# Patient Record
Sex: Male | Born: 1985
Health system: Southern US, Community
[De-identification: ages and names within clinical notes are randomized; demographics above are authoritative.]

## PROBLEM LIST (undated history)

## (undated) DIAGNOSIS — F1910 Other psychoactive substance abuse, uncomplicated: Secondary | ICD-10-CM

## (undated) DIAGNOSIS — F329 Major depressive disorder, single episode, unspecified: Secondary | ICD-10-CM

## (undated) DIAGNOSIS — B001 Herpesviral vesicular dermatitis: Secondary | ICD-10-CM

## (undated) DIAGNOSIS — F419 Anxiety disorder, unspecified: Secondary | ICD-10-CM

## (undated) DIAGNOSIS — F32A Depression, unspecified: Secondary | ICD-10-CM

## (undated) HISTORY — DX: Depression, unspecified: F32.A

## (undated) HISTORY — DX: Other psychoactive substance abuse, uncomplicated: F19.10

## (undated) HISTORY — DX: Major depressive disorder, single episode, unspecified: F32.9

## (undated) HISTORY — DX: Herpesviral vesicular dermatitis: B00.1

## (undated) HISTORY — DX: Anxiety disorder, unspecified: F41.9

---
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
## 2011-07-14 ENCOUNTER — Other Ambulatory Visit: Payer: Self-pay | Admitting: Family Medicine

## 2011-07-14 DIAGNOSIS — N50812 Left testicular pain: Secondary | ICD-10-CM

## 2011-07-18 ENCOUNTER — Ambulatory Visit
Admission: RE | Admit: 2011-07-18 | Discharge: 2011-07-18 | Disposition: A | Discharge status: Home / Self Care | Payer: BC Managed Care – PPO | Source: Ambulatory Visit | Attending: Family Medicine | Admitting: Family Medicine

## 2011-07-18 ENCOUNTER — Other Ambulatory Visit: Payer: Self-pay | Admitting: Family Medicine

## 2011-07-18 DIAGNOSIS — N50812 Left testicular pain: Secondary | ICD-10-CM

## 2013-07-03 IMAGING — US US SCROTUM
1 series · 13 of 25 positions shown · non-contrast
Comparison: None

CLINICAL DATA: Left testicular pain and small palpable lump for
several months

SCROTAL ULTRASOUND
DOPPLER ULTRASOUND OF THE TESTICLES
TECHNIQUE: Complete ultrasound examination of the testicles,
epididymis, and other scrotal structures was performed.  Color and
spectral Doppler ultrasound were also utilized to evaluate blood
flow to the testicles.

[Series 1: us scrotum · 0.07mm/px · 13 of 25 slices shown]
[im 1/25]
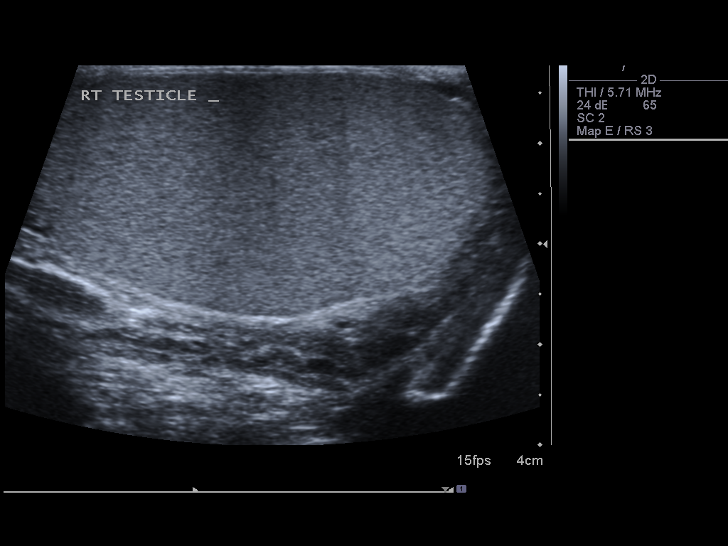
[im 3/25]
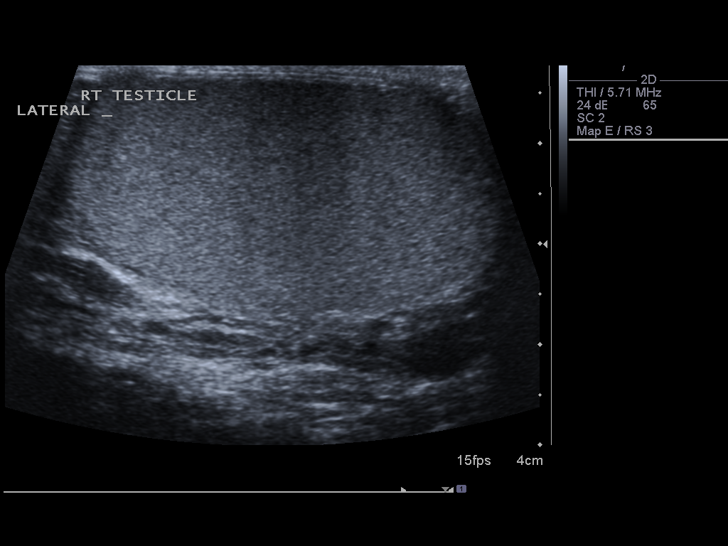
[im 5/25]
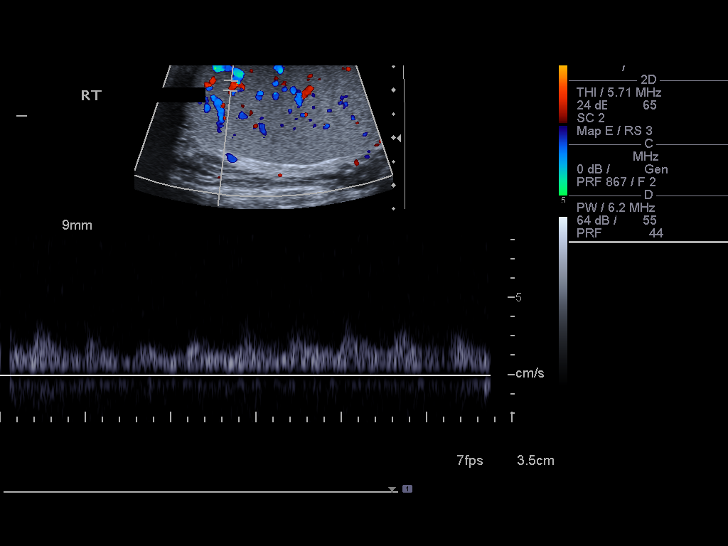
[im 7/25]
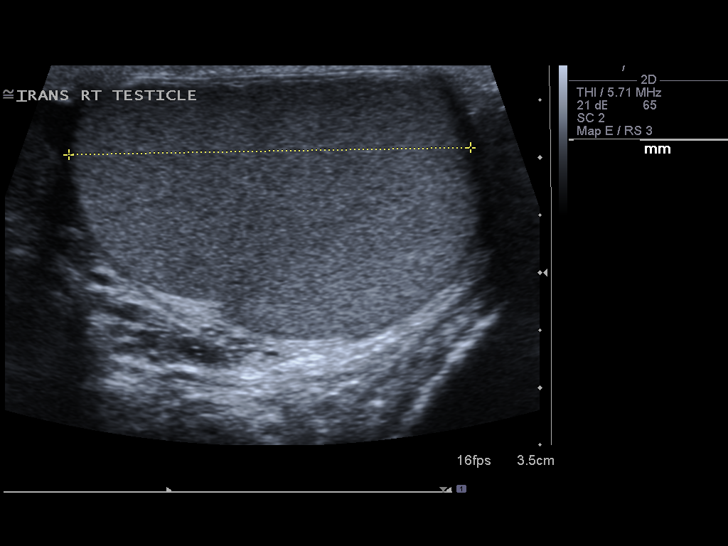
[im 9/25]
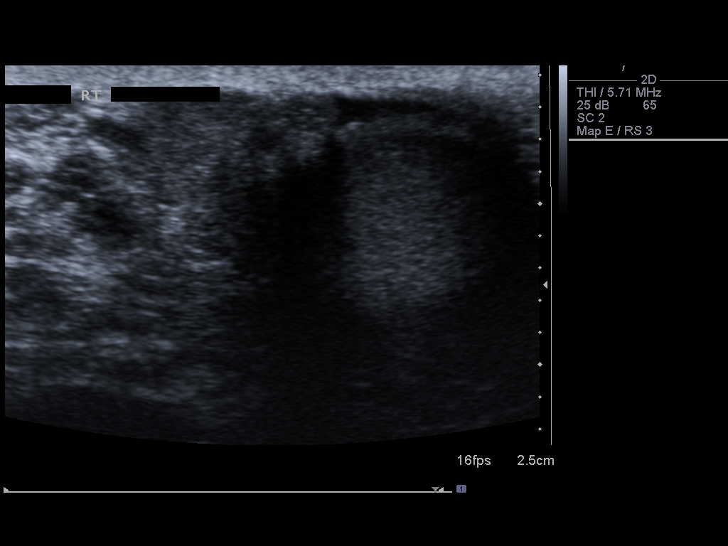
[im 11/25]
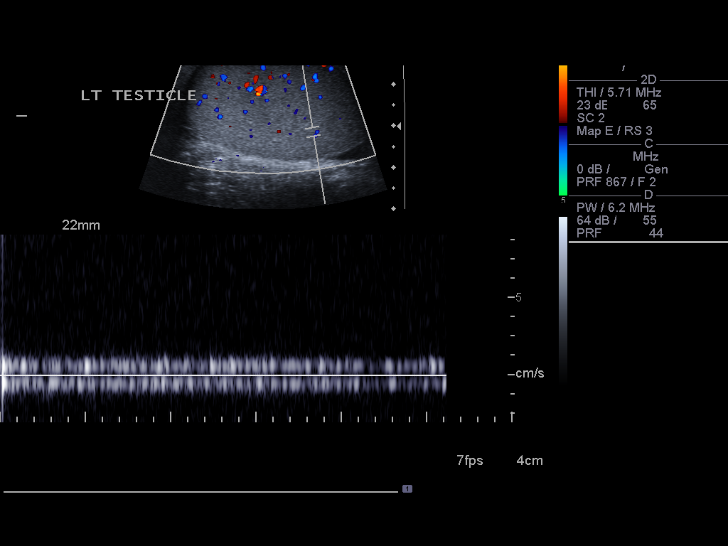
[im 13/25]
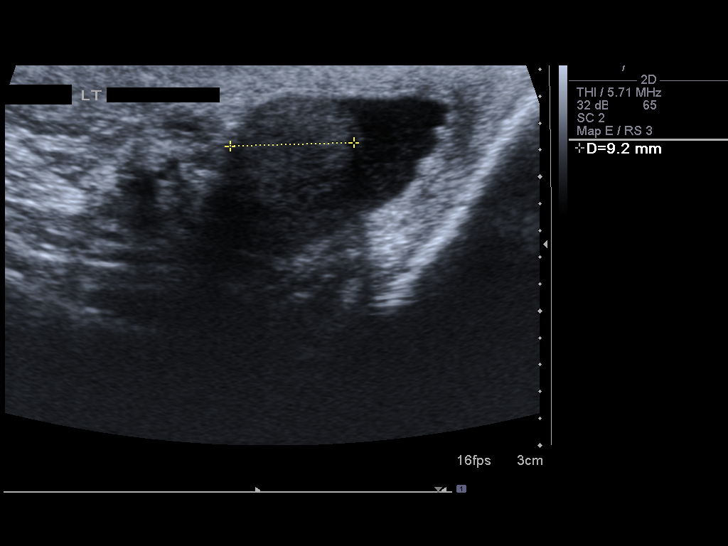
[im 15/25]
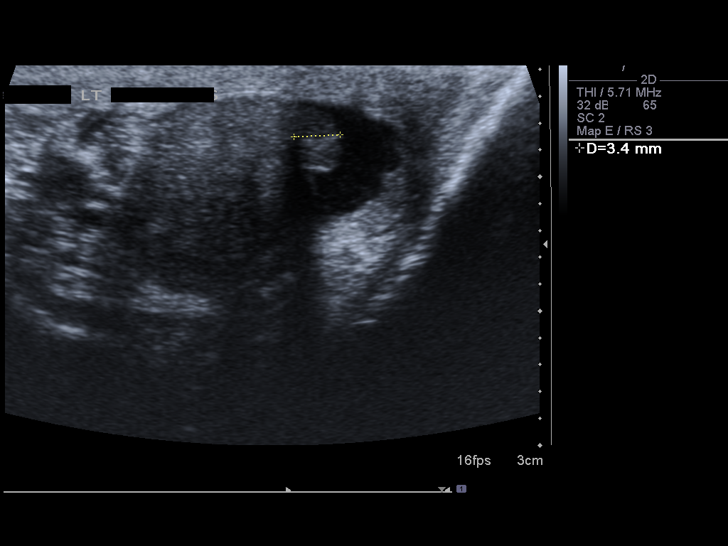
[im 17/25]
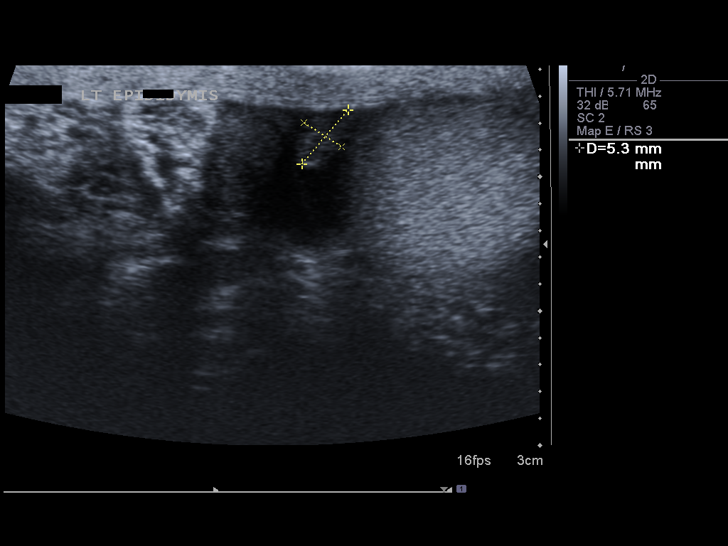
[im 19/25]
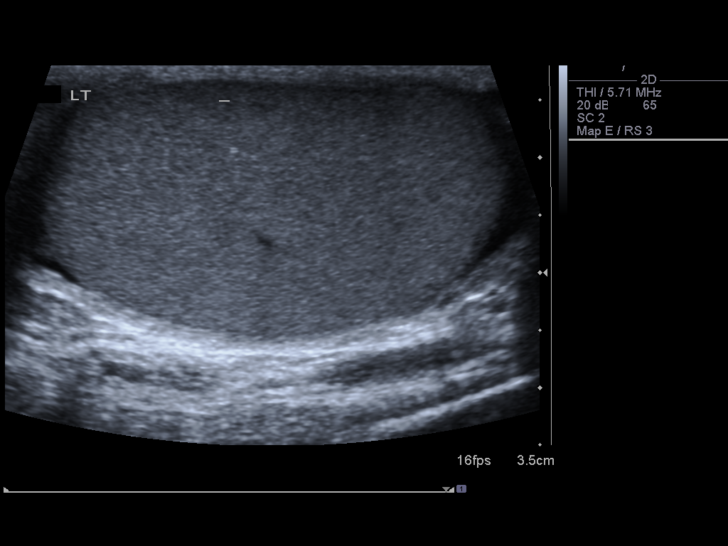
[im 21/25]
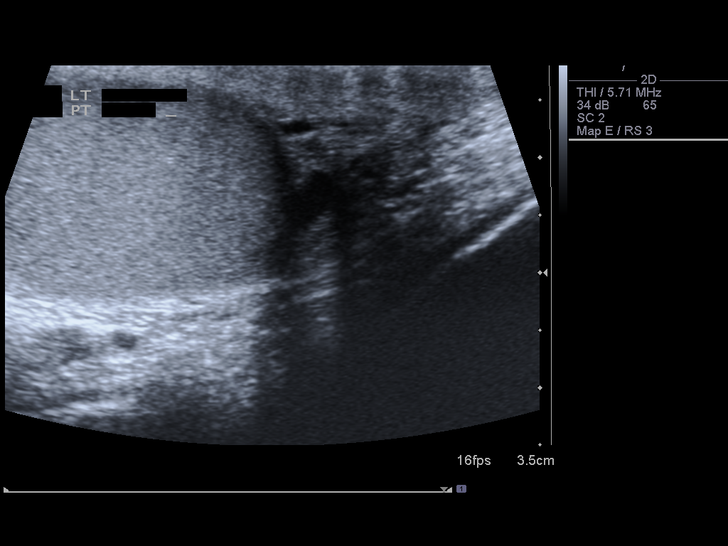
[im 23/25]
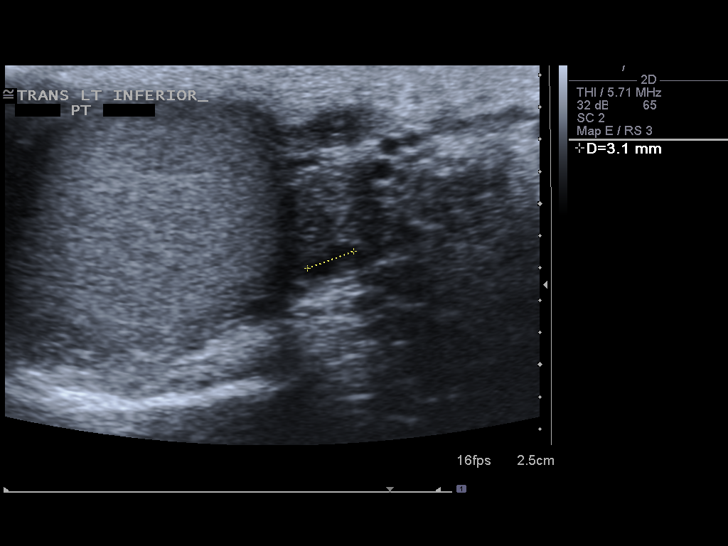
[im 25/25]
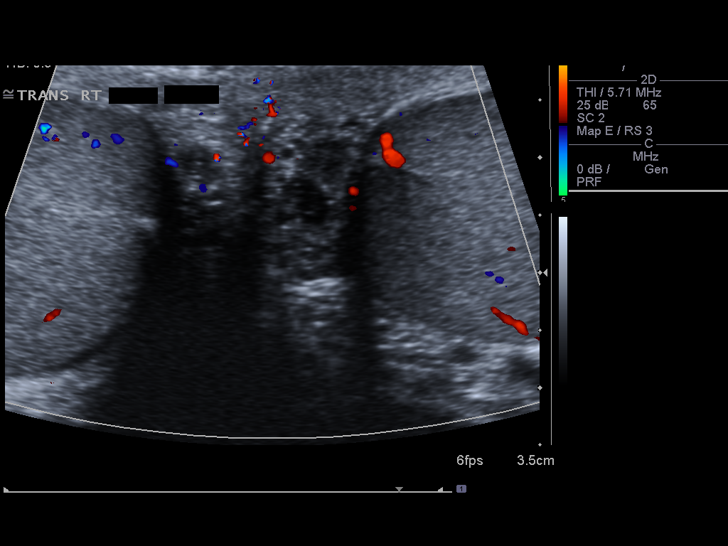

[13 of 25 positions shown; findings below may reference images not displayed]

FINDINGS: Right testis:  The right testicle measures 4.3 x 2.3 x 3.5 cm.
Blood flow is demonstrated with arterial and venous waveforms.

Left testis:  The left testicle measures 4.5 x 2.7 x 3.2 cm.  Blood
flow is demonstrated with arterial and venous waveforms.

Right epididymis:  The right epididymis appears normal.

Left epididymis:  The left epididymis is normal.

Hydocele:  It a small amount of fluid is noted bilaterally.

Varicocele:  No hydrocele is seen.

The area palpated appears to represent a small extratesticular
cystic structure inferior to the left testicle measuring 5 x 3 x 3
mm of doubtful significance.

Pulsed Doppler interrogation of both testes demonstrates low
resistance flow bilaterally.
IMPRESSION: 1.  No intratesticular abnormality.  Blood flow is demonstrated to
both testicles.
2.  Small cystic structure extratesticular and  inferior to the
left testicle appears to represent the palpable abnormality and is
of doubtful clinical significance.
3.  Small amount of fluid bilaterally.

## 2015-08-02 ENCOUNTER — Ambulatory Visit (INDEPENDENT_AMBULATORY_CARE_PROVIDER_SITE_OTHER): Payer: BC Managed Care – PPO | Admitting: Urgent Care

## 2015-08-02 DIAGNOSIS — G44209 Tension-type headache, unspecified, not intractable: Secondary | ICD-10-CM | POA: Diagnosis not present

## 2015-08-02 DIAGNOSIS — H0016 Chalazion left eye, unspecified eyelid: Secondary | ICD-10-CM

## 2015-08-02 DIAGNOSIS — M436 Torticollis: Secondary | ICD-10-CM | POA: Diagnosis not present

## 2015-08-02 MED ORDER — CYCLOBENZAPRINE HCL 5 MG PO TABS: 5.0000 mg | ORAL_TABLET | Freq: Every day | ORAL | Status: DC
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
## 2015-11-20 DIAGNOSIS — F3342 Major depressive disorder, recurrent, in full remission: Secondary | ICD-10-CM | POA: Diagnosis not present

## 2015-11-20 DIAGNOSIS — F410 Panic disorder [episodic paroxysmal anxiety] without agoraphobia: Secondary | ICD-10-CM | POA: Diagnosis not present

## 2015-11-20 DIAGNOSIS — F411 Generalized anxiety disorder: Secondary | ICD-10-CM | POA: Diagnosis not present

## 2015-12-14 DIAGNOSIS — Z202 Contact with and (suspected) exposure to infections with a predominantly sexual mode of transmission: Secondary | ICD-10-CM | POA: Diagnosis not present

## 2016-03-27 DIAGNOSIS — J029 Acute pharyngitis, unspecified: Secondary | ICD-10-CM | POA: Diagnosis not present

## 2016-05-13 DIAGNOSIS — F411 Generalized anxiety disorder: Secondary | ICD-10-CM | POA: Diagnosis not present

## 2016-05-13 DIAGNOSIS — F3342 Major depressive disorder, recurrent, in full remission: Secondary | ICD-10-CM | POA: Diagnosis not present

## 2016-06-26 DIAGNOSIS — Z7251 High risk heterosexual behavior: Secondary | ICD-10-CM | POA: Diagnosis not present

## 2016-06-26 DIAGNOSIS — K120 Recurrent oral aphthae: Secondary | ICD-10-CM | POA: Diagnosis not present

## 2016-07-07 ENCOUNTER — Telehealth: Payer: Self-pay | Admitting: Internal Medicine

## 2016-07-07 NOTE — Telephone Encounter (Signed)
Pt called request to be new pt. Please advise, he said he spoke with already.   [id number] goes by [employer]

## 2016-07-10 NOTE — Telephone Encounter (Signed)
yes

## 2016-07-11 NOTE — Telephone Encounter (Signed)
[name] please help when you get a chance

## 2016-07-11 NOTE — Telephone Encounter (Signed)
Patient already scheduled

## 2016-07-24 ENCOUNTER — Encounter: Payer: Self-pay | Admitting: Internal Medicine

## 2016-07-24 ENCOUNTER — Ambulatory Visit (INDEPENDENT_AMBULATORY_CARE_PROVIDER_SITE_OTHER): Payer: BLUE CROSS/BLUE SHIELD | Admitting: Internal Medicine

## 2016-07-24 ENCOUNTER — Other Ambulatory Visit (INDEPENDENT_AMBULATORY_CARE_PROVIDER_SITE_OTHER): Payer: BLUE CROSS/BLUE SHIELD

## 2016-07-24 VITALS — BP 130/86 | HR 88 | Temp 98.0°F | Resp 12 | Ht 76.0 in | Wt 185.8 lb

## 2016-07-24 DIAGNOSIS — Z206 Contact with and (suspected) exposure to human immunodeficiency virus [HIV]: Secondary | ICD-10-CM

## 2016-07-24 DIAGNOSIS — Z0000 Encounter for general adult medical examination without abnormal findings: Secondary | ICD-10-CM

## 2016-07-24 LAB — URINALYSIS, ROUTINE W REFLEX MICROSCOPIC
Bilirubin Urine: NEGATIVE
Ketones, ur: NEGATIVE
Leukocytes, UA: NEGATIVE
Nitrite: NEGATIVE
Specific Gravity, Urine: 1.015 (ref 1.000–1.030)
Total Protein, Urine: NEGATIVE
Urine Glucose: NEGATIVE
Urobilinogen, UA: 0.2 (ref 0.0–1.0)
pH: 6 (ref 5.0–8.0)

## 2016-07-24 LAB — CBC WITH DIFFERENTIAL/PLATELET
Basophils Absolute: 0 10*3/uL (ref 0.0–0.1)
Basophils Relative: 0.7 % (ref 0.0–3.0)
Eosinophils Absolute: 0 10*3/uL (ref 0.0–0.7)
Eosinophils Relative: 0.6 % (ref 0.0–5.0)
HCT: 42.4 % (ref 39.0–52.0)
Hemoglobin: 14.5 g/dL (ref 13.0–17.0)
Lymphocytes Relative: 30.5 % (ref 12.0–46.0)
Lymphs Abs: 1.9 10*3/uL (ref 0.7–4.0)
MCHC: 34.2 g/dL (ref 30.0–36.0)
MCV: 87.8 fl (ref 78.0–100.0)
Monocytes Absolute: 1.1 10*3/uL — ABNORMAL HIGH (ref 0.1–1.0)
Monocytes Relative: 17.4 % — ABNORMAL HIGH (ref 3.0–12.0)
Neutro Abs: 3.2 10*3/uL (ref 1.4–7.7)
Neutrophils Relative %: 50.8 % (ref 43.0–77.0)
Platelets: 214 10*3/uL (ref 150.0–400.0)
RBC: 4.82 Mil/uL (ref 4.22–5.81)
RDW: 12.1 % (ref 11.5–15.5)
WBC: 6.2 10*3/uL (ref 4.0–10.5)

## 2016-07-24 LAB — TSH: TSH: 0.98 u[IU]/mL (ref 0.35–4.50)

## 2016-07-24 LAB — LIPID PANEL
Cholesterol: 129 mg/dL (ref 0–200)
HDL: 35.6 mg/dL — ABNORMAL LOW (ref >39.00)
LDL Cholesterol: 69 mg/dL (ref 0–99)
NonHDL: 93.02
Total CHOL/HDL Ratio: 4
Triglycerides: 118 mg/dL (ref 0.0–149.0)
VLDL: 23.6 mg/dL (ref 0.0–40.0)

## 2016-07-24 LAB — COMPREHENSIVE METABOLIC PANEL
ALT: 29 U/L (ref 0–53)
AST: 24 U/L (ref 0–37)
Albumin: 4.4 g/dL (ref 3.5–5.2)
Alkaline Phosphatase: 56 U/L (ref 39–117)
BUN: 15 mg/dL (ref 6–23)
CO2: 28 mEq/L (ref 19–32)
Calcium: 9.1 mg/dL (ref 8.4–10.5)
Chloride: 104 mEq/L (ref 96–112)
Creatinine, Ser: 1.02 mg/dL (ref 0.40–1.50)
GFR: 90.24 mL/min (ref >60.00)
Glucose, Bld: 97 mg/dL (ref 70–99)
Potassium: 4.1 mEq/L (ref 3.5–5.1)
Sodium: 140 mEq/L (ref 135–145)
Total Bilirubin: 0.3 mg/dL (ref 0.2–1.2)
Total Protein: 6.9 g/dL (ref 6.0–8.3)

## 2016-07-24 LAB — HEPATITIS B SURFACE ANTIBODY,QUALITATIVE: Hep B S Ab: NEGATIVE

## 2016-07-24 LAB — HEPATITIS B SURFACE ANTIGEN: Hepatitis B Surface Ag: NEGATIVE

## 2016-07-24 LAB — HEPATITIS B CORE ANTIBODY, TOTAL: Hep B Core Total Ab: NONREACTIVE

## 2016-07-24 LAB — HEPATITIS A ANTIBODY, TOTAL: Hep A Total Ab: NONREACTIVE

## 2016-07-24 LAB — HIV ANTIBODY (ROUTINE TESTING W REFLEX): HIV 1&2 Ab, 4th Generation: NONREACTIVE

## 2016-07-24 NOTE — Patient Instructions (Signed)
Safe Sex Safe sex is about reducing the risk of giving or getting a sexually transmitted disease (STD). STDs are spread through sexual contact involving the genitals, mouth, or rectum. Some STDs can be cured and others cannot. Safe sex can also prevent unintended pregnancies.  WHAT ARE SOME SAFE SEX PRACTICES?  Limit your sexual activity to only one partner who is having sex with only you.  Talk to your partner about his or her past partners, past STDs, and drug use.  Use a condom every time you have sexual intercourse. This includes vaginal, oral, and anal sexual activity. Both females and males should wear condoms during oral sex. Only use latex or polyurethane condoms and water-based lubricants. Using petroleum-based lubricants or oils to lubricate a condom will weaken the condom and increase the chance that it will break. The condom should be in place from the beginning to the end of sexual activity. Wearing a condom reduces, but does not completely eliminate, your risk of getting or giving an STD. STDs can be spread by contact with infected body fluids and skin.  Get vaccinated for hepatitis B and HPV.  Avoid alcohol and recreational drugs, which can affect your judgment. You may forget to use a condom or participate in high-risk sex.  For females, avoid douching after sexual intercourse. Douching can spread an infection farther into the reproductive tract.  Check your body for signs of sores, blisters, rashes, or unusual discharge. See your health care provider if you notice any of these signs.  Avoid sexual contact if you have symptoms of an infection or are being treated for an STD. If you or your partner has herpes, avoid sexual contact when blisters are present. Use condoms at all other times.  If you are at risk of being infected with HIV, it is recommended that you take a prescription medicine daily to prevent HIV infection. This is called pre-exposure prophylaxis (PrEP). You are  considered at risk if:  You are a man who has sex with other men (MSM).  You are a heterosexual man or woman who is sexually active with more than one partner.  You take drugs by injection.  You are sexually active with a partner who has HIV.  Talk with your health care provider about whether you are at high risk of being infected with HIV. If you choose to begin PrEP, you should first be tested for HIV. You should then be tested every 3 months for as long as you are taking PrEP.  See your health care provider for regular screenings, exams, and tests for other STDs. Before having sex with a new partner, each of you should be screened for STDs and should talk about the results with each other. WHAT ARE THE BENEFITS OF SAFE SEX?   There is less chance of getting or giving an STD.  You can prevent unwanted or unintended pregnancies.  By discussing safe sex concerns with your partner, you may increase feelings of intimacy, comfort, trust, and honesty between the two of you. This information is not intended to replace advice given to you by your health care provider. Make sure you discuss any questions you have with your health care provider. Document Released: 08/03/2004 Document Revised: 07/17/2014 Document Reviewed: 05/16/2015 Elsevier Interactive Patient Education  2017 [employer].

## 2016-07-24 NOTE — Progress Notes (Signed)
Pre visit review using our clinic review tool, if applicable. No additional management support is needed unless otherwise documented below in the visit note. 

## 2016-07-24 NOTE — Progress Notes (Signed)
Subjective:  Patient ID: Zachary Becker, male    DOB: [birth year]  Age: 31 y.o. MRN: [id number]  CC: Annual Exam  NEW TO ME  HPI Zachary Becker presents for a CPX.  He is dating someone and is sexually active with them and they are HIV positive. He does admit to unprotected oral and anal intercourse. He thinks the partner has an undetectable viral load but he is not certain. He wants to take PreP to prevent HIV infection.  History Zachary Becker has a past medical history of Anxiety and Depression.   He has no past surgical history on file.   His family history is not on file.He reports that he has never smoked. He has never used smokeless tobacco. He reports that he does not drink alcohol or use drugs.  Outpatient Medications Prior to Visit  Medication Sig Dispense Refill  . ALPRAZolam (XANAX XR) 0.5 MG 24 hr tablet Take 0.5 mg by mouth daily.    Marland Kitchen buPROPion (WELLBUTRIN SR) 150 MG 12 hr tablet Take 150 mg by mouth 2 (two) times daily.    . cyclobenzaprine (FLEXERIL) 5 MG tablet Take 1-2 tablets (5-10 mg total) by mouth at bedtime. (Patient not taking: Reported on 07/24/2016) 60 tablet 1  . QUEtiapine (SEROQUEL) 100 MG tablet Take 150 mg by mouth at bedtime.     No facility-administered medications prior to visit.     ROS Review of Systems  Constitutional: Negative.  Negative for chills, fatigue and unexpected weight change.  HENT: Negative.  Negative for sore throat and trouble swallowing.   Eyes: Negative for visual disturbance.  Respiratory: Negative for cough, shortness of breath, wheezing and stridor.   Cardiovascular: Negative for chest pain, palpitations and leg swelling.  Gastrointestinal: Negative for abdominal pain, constipation, diarrhea, nausea and vomiting.  Endocrine: Negative.   Genitourinary: Negative.  Negative for difficulty urinating, discharge, dysuria, genital sores, hematuria, penile pain, scrotal swelling, testicular pain and urgency.    Musculoskeletal: Negative for back pain, myalgias and neck pain.  Skin: Negative.  Negative for color change and rash.  Allergic/Immunologic: Negative.   Neurological: Negative.  Negative for dizziness.  Hematological: Negative for adenopathy. Does not bruise/bleed easily.  Psychiatric/Behavioral: Negative.     Objective:  BP 130/86   Pulse 88   Temp 98 F (36.7 C) (Oral)   Resp 12   Ht 6\' 4"  (1.93 m)   Wt 185 lb 12.8 oz (84.3 kg)   SpO2 98%   BMI 22.62 kg/m   Physical Exam  Constitutional: He is oriented to person, place, and time. No distress.  HENT:  Mouth/Throat: Oropharynx is clear and moist. No oropharyngeal exudate.  Eyes: Conjunctivae are normal. Right eye exhibits no discharge. Left eye exhibits no discharge. No scleral icterus.  Neck: Normal range of motion. Neck supple. No JVD present. No tracheal deviation present. No thyromegaly present.  Cardiovascular: Normal rate, regular rhythm, normal heart sounds and intact distal pulses.  Exam reveals no gallop and no friction rub.   No murmur heard. Pulmonary/Chest: Effort normal and breath sounds normal. No stridor. No respiratory distress. He has no wheezes. He has no rales. He exhibits no tenderness.  Abdominal: Soft. Bowel sounds are normal. He exhibits no distension and no mass. There is no tenderness. There is no rebound and no guarding. Hernia confirmed negative in the right inguinal area and confirmed negative in the left inguinal area.  Genitourinary: Testes normal and penis normal. Right testis shows no mass, no  swelling and no tenderness. Right testis is descended. Left testis shows no mass, no swelling and no tenderness. Left testis is descended. Circumcised. No penile erythema or penile tenderness. No discharge found.  Musculoskeletal: Normal range of motion. He exhibits no edema, tenderness or deformity.  Lymphadenopathy:    He has no cervical adenopathy.       Right: No inguinal adenopathy present.       Left:  No inguinal adenopathy present.  Neurological: He is oriented to person, place, and time.  Skin: Skin is warm and dry. No rash noted. He is not diaphoretic. No erythema. No pallor.  Psychiatric: He has a normal mood and affect. His behavior is normal. Judgment and thought content normal.  Vitals reviewed.   Lab Results  Component Value Date   WBC 6.2 07/24/2016   HGB 14.5 07/24/2016   HCT 42.4 07/24/2016   PLT 214.0 07/24/2016   GLUCOSE 97 07/24/2016   CHOL 129 07/24/2016   TRIG 118.0 07/24/2016   HDL 35.60 (L) 07/24/2016   LDLCALC 69 07/24/2016   ALT 29 07/24/2016   AST 24 07/24/2016   NA 140 07/24/2016   K 4.1 07/24/2016   CL 104 07/24/2016   CREATININE 1.02 07/24/2016   BUN 15 07/24/2016   CO2 28 07/24/2016   TSH 0.98 07/24/2016    Assessment & Plan:   Zachary Becker was seen today for annual exam.  Diagnoses and all orders for this visit:  Routine general medical examination at a health care facility- exam completed, labs ordered and reviewed, vaccines reviewed and updated, he has no antibodies to hepatitis A and B so I asked him to return to start the vaccine process, patient education material was given. -     Lipid panel; Future -     Comprehensive metabolic panel; Future -     CBC with Differential/Platelet; Future -     TSH; Future -     Urinalysis, Routine w reflex microscopic; Future -     Hepatitis B surface antigen; Future -     HIV antibody; Future -     Hepatitis A antibody, total; Future -     Hepatitis B core antibody, total; Future -     Hepatitis B surface antibody; Future -     RPR; Future  HIV exposure- he is HIV-negative, He is negative for hepatitis A and B infection, and has normal renal function, he will start Truvada but I've also asked him to adhere to safe sexual practices, I've asked him to return in 3 months to monitor his renal function and to screen for sexually transmitted infections. -     emtricitabine-tenofovir (TRUVADA) 200-300 MG tablet;  Take 1 tablet by mouth daily.   I have discontinued Zachary Becker buPROPion, ALPRAZolam, QUEtiapine, cyclobenzaprine, and buPROPion. I am also having him start on emtricitabine-tenofovir.  Meds ordered this encounter  Medications  . DISCONTD: buPROPion (WELLBUTRIN XL) 300 MG 24 hr tablet    Sig: Take 300 mg by mouth daily.  Marland Kitchen emtricitabine-tenofovir (TRUVADA) 200-300 MG tablet    Sig: Take 1 tablet by mouth daily.    Dispense:  90 tablet    Refill:  1     Follow-up: Return in about 3 months (around 10/22/2016).  Scarlette Calico, MD

## 2016-07-25 ENCOUNTER — Encounter: Payer: Self-pay | Admitting: Internal Medicine

## 2016-07-25 LAB — RPR

## 2016-07-25 MED ORDER — EMTRICITABINE-TENOFOVIR DF 200-300 MG PO TABS: 1.0000 | ORAL_TABLET | Freq: Every day | ORAL | 1 refills | Status: DC

## 2016-08-13 ENCOUNTER — Encounter: Payer: Self-pay | Admitting: Internal Medicine

## 2016-08-22 ENCOUNTER — Ambulatory Visit (INDEPENDENT_AMBULATORY_CARE_PROVIDER_SITE_OTHER): Payer: BLUE CROSS/BLUE SHIELD | Admitting: Internal Medicine

## 2016-08-22 ENCOUNTER — Encounter: Payer: Self-pay | Admitting: Internal Medicine

## 2016-08-22 ENCOUNTER — Other Ambulatory Visit (INDEPENDENT_AMBULATORY_CARE_PROVIDER_SITE_OTHER): Payer: BLUE CROSS/BLUE SHIELD

## 2016-08-22 VITALS — BP 120/70 | HR 103 | Temp 98.1°F | Resp 16 | Wt 184.2 lb

## 2016-08-22 DIAGNOSIS — Z206 Contact with and (suspected) exposure to human immunodeficiency virus [HIV]: Secondary | ICD-10-CM | POA: Diagnosis not present

## 2016-08-22 DIAGNOSIS — D72821 Monocytosis (symptomatic): Secondary | ICD-10-CM

## 2016-08-22 DIAGNOSIS — Z79899 Other long term (current) drug therapy: Secondary | ICD-10-CM

## 2016-08-22 DIAGNOSIS — B001 Herpesviral vesicular dermatitis: Secondary | ICD-10-CM

## 2016-08-22 DIAGNOSIS — Z0000 Encounter for general adult medical examination without abnormal findings: Secondary | ICD-10-CM | POA: Insufficient documentation

## 2016-08-22 LAB — BASIC METABOLIC PANEL
BUN: 15 mg/dL (ref 6–23)
CO2: 31 mEq/L (ref 19–32)
Calcium: 9.5 mg/dL (ref 8.4–10.5)
Chloride: 104 mEq/L (ref 96–112)
Creatinine, Ser: 1.11 mg/dL (ref 0.40–1.50)
GFR: 81.81 mL/min (ref >60.00)
Glucose, Bld: 99 mg/dL (ref 70–99)
Potassium: 4.9 mEq/L (ref 3.5–5.1)
Sodium: 140 mEq/L (ref 135–145)

## 2016-08-22 MED ORDER — VALACYCLOVIR HCL 1 G PO TABS: 1000.0000 mg | ORAL_TABLET | Freq: Three times a day (TID) | ORAL | 3 refills | Status: DC

## 2016-08-22 NOTE — Patient Instructions (Signed)
Safe Sex Safe sex is about reducing the risk of giving or getting a sexually transmitted disease (STD). STDs are spread through sexual contact involving the genitals, mouth, or rectum. Some STDs can be cured and others cannot. Safe sex can also prevent unintended pregnancies.  WHAT ARE SOME SAFE SEX PRACTICES?  Limit your sexual activity to only one partner who is having sex with only you.  Talk to your partner about his or her past partners, past STDs, and drug use.  Use a condom every time you have sexual intercourse. This includes vaginal, oral, and anal sexual activity. Both females and males should wear condoms during oral sex. Only use latex or polyurethane condoms and water-based lubricants. Using petroleum-based lubricants or oils to lubricate a condom will weaken the condom and increase the chance that it will break. The condom should be in place from the beginning to the end of sexual activity. Wearing a condom reduces, but does not completely eliminate, your risk of getting or giving an STD. STDs can be spread by contact with infected body fluids and skin.  Get vaccinated for hepatitis B and HPV.  Avoid alcohol and recreational drugs, which can affect your judgment. You may forget to use a condom or participate in high-risk sex.  For females, avoid douching after sexual intercourse. Douching can spread an infection farther into the reproductive tract.  Check your body for signs of sores, blisters, rashes, or unusual discharge. See your health care provider if you notice any of these signs.  Avoid sexual contact if you have symptoms of an infection or are being treated for an STD. If you or your partner has herpes, avoid sexual contact when blisters are present. Use condoms at all other times.  If you are at risk of being infected with HIV, it is recommended that you take a prescription medicine daily to prevent HIV infection. This is called pre-exposure prophylaxis (PrEP). You are  considered at risk if:  You are a man who has sex with other men (MSM).  You are a heterosexual man or woman who is sexually active with more than one partner.  You take drugs by injection.  You are sexually active with a partner who has HIV.  Talk with your health care provider about whether you are at high risk of being infected with HIV. If you choose to begin PrEP, you should first be tested for HIV. You should then be tested every 3 months for as long as you are taking PrEP.  See your health care provider for regular screenings, exams, and tests for other STDs. Before having sex with a new partner, each of you should be screened for STDs and should talk about the results with each other. WHAT ARE THE BENEFITS OF SAFE SEX?   There is less chance of getting or giving an STD.  You can prevent unwanted or unintended pregnancies.  By discussing safe sex concerns with your partner, you may increase feelings of intimacy, comfort, trust, and honesty between the two of you. This information is not intended to replace advice given to you by your health care provider. Make sure you discuss any questions you have with your health care provider. Document Released: 08/03/2004 Document Revised: 07/17/2014 Document Reviewed: 05/16/2015 Elsevier Interactive Patient Education  2017 [employer].

## 2016-08-22 NOTE — Progress Notes (Signed)
Subjective:  Patient ID: Zachary Becker, male    DOB: 04/03/1986  Age: 31 y.o. MRN: [id number]  CC: Rectal Pain   HPI Zachary Becker presents for Concerns that he has noticed a bump around the right side of his anus for several days. It is not causing him any discomfort, itching, bleeding, or soreness.  He also returns for an HIV test. He had an HIV-positive partner until a month ago and they have since broken up. His last exposure to the HIV-positive partner was about a month ago. He wants to continue taking PrEP because he says he has about 5 or 6 partners/year, he uses some hookup sites and does admit to occasional unprotected intercourse.  He has cold sore outbreaks about 3 or 4 times a year and wants to continue taking Valtrex as needed.  Outpatient Medications Prior to Visit  Medication Sig Dispense Refill  . emtricitabine-tenofovir (TRUVADA) 200-300 MG tablet Take 1 tablet by mouth daily. 90 tablet 1   No facility-administered medications prior to visit.     ROS Review of Systems  Constitutional: Negative for chills and fever.  HENT: Negative.  Negative for sore throat and trouble swallowing.   Eyes: Negative for visual disturbance.  Respiratory: Negative for cough, chest tightness, shortness of breath and wheezing.   Cardiovascular: Negative.  Negative for chest pain, palpitations and leg swelling.  Gastrointestinal: Negative for abdominal pain, anal bleeding, blood in stool, diarrhea, nausea and rectal pain.  Endocrine: Negative.   Genitourinary: Negative.  Negative for difficulty urinating, discharge, dysuria, genital sores, penile swelling, scrotal swelling and testicular pain.  Musculoskeletal: Negative.   Allergic/Immunologic: Negative.   Neurological: Negative.   Hematological: Negative.  Negative for adenopathy. Does not bruise/bleed easily.  Psychiatric/Behavioral: Negative.     Objective:  BP 120/70 (BP Location: Left Arm, Patient Position:  Sitting, Cuff Size: Normal)   Pulse (!) 103   Temp 98.1 F (36.7 C) (Oral)   Resp 16   Wt 184 lb 4 oz (83.6 kg)   SpO2 98%   BMI 22.43 kg/m   BP Readings from Last 3 Encounters:  08/22/16 120/70  07/24/16 130/86  08/02/15 126/79    Wt Readings from Last 3 Encounters:  08/22/16 184 lb 4 oz (83.6 kg)  07/24/16 185 lb 12.8 oz (84.3 kg)  08/02/15 185 lb (83.9 kg)    Physical Exam  Constitutional: He is oriented to person, place, and time. No distress.  HENT:  Mouth/Throat: Oropharynx is clear and moist. No oropharyngeal exudate.  Eyes: Conjunctivae are normal. Right eye exhibits no discharge. Left eye exhibits no discharge. No scleral icterus.  Neck: Normal range of motion. Neck supple. No JVD present. No tracheal deviation present. No thyromegaly present.  Cardiovascular: Normal rate, regular rhythm, normal heart sounds and intact distal pulses.  Exam reveals no gallop and no friction rub.   No murmur heard. Pulmonary/Chest: Effort normal and breath sounds normal. No stridor. No respiratory distress. He has no wheezes. He has no rales. He exhibits no tenderness.  Abdominal: Soft. Bowel sounds are normal. He exhibits no distension and no mass. There is no tenderness. There is no rebound and no guarding. Hernia confirmed negative in the left inguinal area.  Genitourinary: Testes normal and penis normal. Rectal exam shows external hemorrhoid. Rectal exam shows no internal hemorrhoid, no fissure, no mass, no tenderness, anal tone normal and guaiac negative stool. Right testis shows no mass, no swelling and no tenderness. Right testis is descended.  Left testis shows no mass, no swelling and no tenderness. Left testis is descended. Circumcised. No penile erythema or penile tenderness. No discharge found.     Musculoskeletal: Normal range of motion. He exhibits no edema, tenderness or deformity.  Lymphadenopathy:    He has no cervical adenopathy.       Right: No inguinal adenopathy  present.       Left: No inguinal adenopathy present.  Neurological: He is oriented to person, place, and time.  Skin: Skin is warm and dry. No rash noted. He is not diaphoretic. No erythema. No pallor.  Vitals reviewed.   Lab Results  Component Value Date   WBC 6.2 07/24/2016   HGB 14.5 07/24/2016   HCT 42.4 07/24/2016   PLT 214.0 07/24/2016   GLUCOSE 99 08/22/2016   CHOL 129 07/24/2016   TRIG 118.0 07/24/2016   HDL 35.60 (L) 07/24/2016   LDLCALC 69 07/24/2016   ALT 29 07/24/2016   AST 24 07/24/2016   NA 140 08/22/2016   K 4.9 08/22/2016   CL 104 08/22/2016   CREATININE 1.11 08/22/2016   BUN 15 08/22/2016   CO2 31 08/22/2016   TSH 0.98 07/24/2016    US Scrotum  Result Date: 07/18/2011 *RADIOLOGY REPORT* Clinical Data:  Left testicular pain and small palpable lump for several months SCROTAL ULTRASOUND DOPPLER ULTRASOUND OF THE TESTICLES Technique: Complete ultrasound examination of the testicles, epididymis, and other scrotal structures was performed.  Color and spectral Doppler ultrasound were also utilized to evaluate blood flow to the testicles. Comparison:  None Findings: Right testis:  The right testicle measures 4.3 x 2.3 x 3.5 cm. Blood flow is demonstrated with arterial and venous waveforms. Left testis:  The left testicle measures 4.5 x 2.7 x 3.2 cm.  Blood flow is demonstrated with arterial and venous waveforms. Right epididymis:  The right epididymis appears normal. Left epididymis:  The left epididymis is normal. Hydocele:  It a small amount of fluid is noted bilaterally. Varicocele:  No hydrocele is seen. The area palpated appears to represent a small extratesticular cystic structure inferior to the left testicle measuring 5 x 3 x 3 mm of doubtful significance. Pulsed Doppler interrogation of both testes demonstrates low resistance flow bilaterally. IMPRESSION: 1.  No intratesticular abnormality.  Blood flow is demonstrated to both testicles. 2.  Small cystic structure  extratesticular and  inferior to the left testicle appears to represent the palpable abnormality and is of doubtful clinical significance. 3.  Small amount of fluid bilaterally. Original Report Authenticated By: Joretta Bachelor, M.D.  [place] Art/ven Flow Abd Pelv Doppler  Result Date: 07/18/2011 *RADIOLOGY REPORT* Clinical Data:  Left testicular pain and small palpable lump for several months SCROTAL ULTRASOUND DOPPLER ULTRASOUND OF THE TESTICLES Technique: Complete ultrasound examination of the testicles, epididymis, and other scrotal structures was performed.  Color and spectral Doppler ultrasound were also utilized to evaluate blood flow to the testicles. Comparison:  None Findings: Right testis:  The right testicle measures 4.3 x 2.3 x 3.5 cm. Blood flow is demonstrated with arterial and venous waveforms. Left testis:  The left testicle measures 4.5 x 2.7 x 3.2 cm.  Blood flow is demonstrated with arterial and venous waveforms. Right epididymis:  The right epididymis appears normal. Left epididymis:  The left epididymis is normal. Hydocele:  It a small amount of fluid is noted bilaterally. Varicocele:  No hydrocele is seen. The area palpated appears to represent a small extratesticular cystic structure inferior to the left testicle measuring  5 x 3 x 3 mm of doubtful significance. Pulsed Doppler interrogation of both testes demonstrates low resistance flow bilaterally. IMPRESSION: 1.  No intratesticular abnormality.  Blood flow is demonstrated to both testicles. 2.  Small cystic structure extratesticular and  inferior to the left testicle appears to represent the palpable abnormality and is of doubtful clinical significance. 3.  Small amount of fluid bilaterally. Original Report Authenticated By: Joretta Bachelor, M.D.   Assessment & Plan:   Khamal was seen today for rectal pain.  Diagnoses and all orders for this visit:  HIV exposure- he remains HIV neg, will cont truvada -     HIV antibody; Future  Herpes  labialis without complication -     valACYclovir (VALTREX) 1000 MG tablet; Take 1 tablet (1,000 mg total) by mouth 3 (three) times daily.  Encounter for long-term (current) use of high-risk medication- his renal fxn is normal -     Basic metabolic panel; Future  Monocytosis- will recheck his CBC during his next visit   I have changed Zachary Becker ValACYclovir HCl (VALTREX PO) to valACYclovir (VALTREX) 1000 MG tablet. I am also having him maintain his buPROPion and emtricitabine-tenofovir.  Meds ordered this encounter  Medications  . buPROPion (WELLBUTRIN XL) 300 MG 24 hr tablet    Sig: Take 300 mg by mouth daily.  Marland Kitchen emtricitabine-tenofovir (TRUVADA) 200-300 MG tablet    Sig: Take 1 tablet by mouth daily.  Marland Kitchen DISCONTD: ValACYclovir HCl (VALTREX PO)    Sig: Take by mouth.  . valACYclovir (VALTREX) 1000 MG tablet    Sig: Take 1 tablet (1,000 mg total) by mouth 3 (three) times daily.    Dispense:  15 tablet    Refill:  3     Follow-up: Return in about 3 months (around 11/19/2016).  Scarlette Calico, MD

## 2016-08-22 NOTE — Progress Notes (Signed)
Pre visit review using our clinic review tool, if applicable. No additional management support is needed unless otherwise documented below in the visit note. 

## 2016-08-23 ENCOUNTER — Other Ambulatory Visit: Payer: Self-pay | Admitting: Internal Medicine

## 2016-08-23 ENCOUNTER — Encounter: Payer: Self-pay | Admitting: Internal Medicine

## 2016-08-23 LAB — HIV ANTIBODY (ROUTINE TESTING W REFLEX): HIV 1&2 Ab, 4th Generation: NONREACTIVE

## 2016-08-24 DIAGNOSIS — D72821 Monocytosis (symptomatic): Secondary | ICD-10-CM | POA: Insufficient documentation

## 2016-10-31 ENCOUNTER — Encounter: Payer: Self-pay | Admitting: Internal Medicine

## 2016-10-31 ENCOUNTER — Ambulatory Visit (INDEPENDENT_AMBULATORY_CARE_PROVIDER_SITE_OTHER): Payer: BLUE CROSS/BLUE SHIELD | Admitting: Internal Medicine

## 2016-10-31 ENCOUNTER — Other Ambulatory Visit (INDEPENDENT_AMBULATORY_CARE_PROVIDER_SITE_OTHER): Payer: BLUE CROSS/BLUE SHIELD

## 2016-10-31 VITALS — BP 126/80 | HR 80 | Temp 98.0°F | Resp 16 | Ht 74.45 in | Wt 188.5 lb

## 2016-10-31 DIAGNOSIS — D72821 Monocytosis (symptomatic): Secondary | ICD-10-CM

## 2016-10-31 DIAGNOSIS — Z720 Tobacco use: Secondary | ICD-10-CM | POA: Diagnosis not present

## 2016-10-31 LAB — CBC WITH DIFFERENTIAL/PLATELET
Basophils Absolute: 0 10*3/uL (ref 0.0–0.1)
Basophils Relative: 0.5 % (ref 0.0–3.0)
Eosinophils Absolute: 0.1 10*3/uL (ref 0.0–0.7)
Eosinophils Relative: 1 % (ref 0.0–5.0)
HCT: 43.9 % (ref 39.0–52.0)
Hemoglobin: 15 g/dL (ref 13.0–17.0)
Lymphocytes Relative: 36.4 % (ref 12.0–46.0)
Lymphs Abs: 3.1 10*3/uL (ref 0.7–4.0)
MCHC: 34.2 g/dL (ref 30.0–36.0)
MCV: 90.8 fl (ref 78.0–100.0)
Monocytes Absolute: 0.8 10*3/uL (ref 0.1–1.0)
Monocytes Relative: 9.6 % (ref 3.0–12.0)
Neutro Abs: 4.4 10*3/uL (ref 1.4–7.7)
Neutrophils Relative %: 52.5 % (ref 43.0–77.0)
Platelets: 257 10*3/uL (ref 150.0–400.0)
RBC: 4.84 Mil/uL (ref 4.22–5.81)
RDW: 12.6 % (ref 11.5–15.5)
WBC: 8.5 10*3/uL (ref 4.0–10.5)

## 2016-10-31 MED ORDER — VARENICLINE TARTRATE 0.5 MG X 11 & 1 MG X 42 PO MISC: ORAL | 0 refills | Status: DC

## 2016-10-31 MED ORDER — VARENICLINE TARTRATE 1 MG PO TABS: 1.0000 mg | ORAL_TABLET | Freq: Two times a day (BID) | ORAL | 3 refills | Status: DC

## 2016-10-31 NOTE — Progress Notes (Signed)
Pre visit review using our clinic review tool, if applicable. No additional management support is needed unless otherwise documented below in the visit note. 

## 2016-10-31 NOTE — Progress Notes (Signed)
Subjective:  Patient ID: Zachary Becker, male    DOB: 1985-12-29  Age: 31 y.o. MRN: [id number]  CC: Nicotine Dependence   HPI Zachary Becker presents for f/up - He has decided to stop taking Truvada. He went through a breakup about 3 months ago and has not been sexually active since then and he doesn't anticipate any sexual activity in the near future.  He wants to try something to help him quit smoking. His last cigarette was about 3 or 4 days prior to this visit and he has not had much in the way of withdrawal symptoms or cravings. He has quit before but the cravings caused him to relapse.  He is also due for follow-up on his CBC. His last CBC shows a mild monocytosis.  Outpatient Medications Prior to Visit  Medication Sig Dispense Refill  . buPROPion (WELLBUTRIN XL) 300 MG 24 hr tablet Take 300 mg by mouth daily.    . valACYclovir (VALTREX) 1000 MG tablet Take 1 tablet (1,000 mg total) by mouth 3 (three) times daily. (Patient not taking: Reported on 10/31/2016) 15 tablet 3  . emtricitabine-tenofovir (TRUVADA) 200-300 MG tablet Take 1 tablet by mouth daily.     No facility-administered medications prior to visit.     ROS Review of Systems  Constitutional: Negative for chills, fatigue and fever.  HENT: Negative for sore throat.   Respiratory: Negative for cough.   Cardiovascular: Negative.   Gastrointestinal: Negative for abdominal pain.  Genitourinary: Negative.  Negative for difficulty urinating, dysuria and genital sores.  Musculoskeletal: Negative.   Skin: Negative.  Negative for color change and rash.  Neurological: Negative.   Hematological: Negative for adenopathy. Does not bruise/bleed easily.  Psychiatric/Behavioral: Negative.   All other systems reviewed and are negative.   Objective:  BP 126/80 (BP Location: Left Arm, Patient Position: Sitting, Cuff Size: Normal)   Pulse 80   Temp 98 F (36.7 C) (Oral)   Resp 16   Ht 6' 2.45" (1.891 m)   Wt  188 lb 8 oz (85.5 kg)   SpO2 98%   BMI 23.91 kg/m   BP Readings from Last 3 Encounters:  10/31/16 126/80  08/22/16 120/70  07/24/16 130/86    Wt Readings from Last 3 Encounters:  10/31/16 188 lb 8 oz (85.5 kg)  08/22/16 184 lb 4 oz (83.6 kg)  07/24/16 185 lb 12.8 oz (84.3 kg)    Physical Exam  Constitutional: He is oriented to person, place, and time. No distress.  HENT:  Mouth/Throat: Oropharynx is clear and moist. No oropharyngeal exudate.  Eyes: Conjunctivae are normal. Right eye exhibits no discharge. Left eye exhibits no discharge. No scleral icterus.  Neck: Normal range of motion. Neck supple. No JVD present. No tracheal deviation present. No thyromegaly present.  Cardiovascular: Normal rate, regular rhythm, normal heart sounds and intact distal pulses.  Exam reveals no gallop and no friction rub.   No murmur heard. Pulmonary/Chest: Effort normal and breath sounds normal. No stridor. No respiratory distress. He has no wheezes. He has no rales. He exhibits no tenderness.  Abdominal: Soft. Bowel sounds are normal. He exhibits no distension and no mass. There is no tenderness. There is no rebound and no guarding.  Musculoskeletal: Normal range of motion. He exhibits no edema, tenderness or deformity.  Lymphadenopathy:    He has no cervical adenopathy.  Neurological: He is oriented to person, place, and time.  Skin: Skin is warm and dry. No rash noted. He  is not diaphoretic. No erythema. No pallor.  Psychiatric: He has a normal mood and affect. His behavior is normal. Judgment and thought content normal.  Vitals reviewed.   Lab Results  Component Value Date   WBC 8.5 10/31/2016   HGB 15.0 10/31/2016   HCT 43.9 10/31/2016   PLT 257.0 10/31/2016   GLUCOSE 99 08/22/2016   CHOL 129 07/24/2016   TRIG 118.0 07/24/2016   HDL 35.60 (L) 07/24/2016   LDLCALC 69 07/24/2016   ALT 29 07/24/2016   AST 24 07/24/2016   NA 140 08/22/2016   K 4.9 08/22/2016   CL 104 08/22/2016    CREATININE 1.11 08/22/2016   BUN 15 08/22/2016   CO2 31 08/22/2016   TSH 0.98 07/24/2016    US Scrotum  Result Date: 07/18/2011 *RADIOLOGY REPORT* Clinical Data:  Left testicular pain and small palpable lump for several months SCROTAL ULTRASOUND DOPPLER ULTRASOUND OF THE TESTICLES Technique: Complete ultrasound examination of the testicles, epididymis, and other scrotal structures was performed.  Color and spectral Doppler ultrasound were also utilized to evaluate blood flow to the testicles. Comparison:  None Findings: Right testis:  The right testicle measures 4.3 x 2.3 x 3.5 cm. Blood flow is demonstrated with arterial and venous waveforms. Left testis:  The left testicle measures 4.5 x 2.7 x 3.2 cm.  Blood flow is demonstrated with arterial and venous waveforms. Right epididymis:  The right epididymis appears normal. Left epididymis:  The left epididymis is normal. Hydocele:  It a small amount of fluid is noted bilaterally. Varicocele:  No hydrocele is seen. The area palpated appears to represent a small extratesticular cystic structure inferior to the left testicle measuring 5 x 3 x 3 mm of doubtful significance. Pulsed Doppler interrogation of both testes demonstrates low resistance flow bilaterally. IMPRESSION: 1.  No intratesticular abnormality.  Blood flow is demonstrated to both testicles. 2.  Small cystic structure extratesticular and  inferior to the left testicle appears to represent the palpable abnormality and is of doubtful clinical significance. 3.  Small amount of fluid bilaterally. Original Report Authenticated By: Joretta Bachelor, M.D.  [place] Art/ven Flow Abd Pelv Doppler  Result Date: 07/18/2011 *RADIOLOGY REPORT* Clinical Data:  Left testicular pain and small palpable lump for several months SCROTAL ULTRASOUND DOPPLER ULTRASOUND OF THE TESTICLES Technique: Complete ultrasound examination of the testicles, epididymis, and other scrotal structures was performed.  Color and spectral Doppler  ultrasound were also utilized to evaluate blood flow to the testicles. Comparison:  None Findings: Right testis:  The right testicle measures 4.3 x 2.3 x 3.5 cm. Blood flow is demonstrated with arterial and venous waveforms. Left testis:  The left testicle measures 4.5 x 2.7 x 3.2 cm.  Blood flow is demonstrated with arterial and venous waveforms. Right epididymis:  The right epididymis appears normal. Left epididymis:  The left epididymis is normal. Hydocele:  It a small amount of fluid is noted bilaterally. Varicocele:  No hydrocele is seen. The area palpated appears to represent a small extratesticular cystic structure inferior to the left testicle measuring 5 x 3 x 3 mm of doubtful significance. Pulsed Doppler interrogation of both testes demonstrates low resistance flow bilaterally. IMPRESSION: 1.  No intratesticular abnormality.  Blood flow is demonstrated to both testicles. 2.  Small cystic structure extratesticular and  inferior to the left testicle appears to represent the palpable abnormality and is of doubtful clinical significance. 3.  Small amount of fluid bilaterally. Original Report Authenticated By: Joretta Bachelor, M.D.  Assessment & Plan:   Gilliam was seen today for nicotine dependence.  Diagnoses and all orders for this visit:  Monocytosis- his CBC and differential are normal now. -     CBC with Differential/Platelet; Future  Tobacco abuse disorder -     varenicline (CHANTIX CONTINUING MONTH PAK) 1 MG tablet; Take 1 tablet (1 mg total) by mouth 2 (two) times daily. -     varenicline (CHANTIX STARTING MONTH PAK) 0.5 MG X 11 & 1 MG X 42 tablet; Take one 0.5 mg tablet by mouth once daily for 3 days, then increase to one 0.5 mg tablet twice daily for 4 days, then increase to one 1 mg tablet twice daily.   I have discontinued Mr. Bolger emtricitabine-tenofovir. I am also having him start on varenicline and varenicline. Additionally, I am having him maintain his buPROPion and  valACYclovir.  Meds ordered this encounter  Medications  . varenicline (CHANTIX CONTINUING MONTH PAK) 1 MG tablet    Sig: Take 1 tablet (1 mg total) by mouth 2 (two) times daily.    Dispense:  60 tablet    Refill:  3  . varenicline (CHANTIX STARTING MONTH PAK) 0.5 MG X 11 & 1 MG X 42 tablet    Sig: Take one 0.5 mg tablet by mouth once daily for 3 days, then increase to one 0.5 mg tablet twice daily for 4 days, then increase to one 1 mg tablet twice daily.    Dispense:  53 tablet    Refill:  0     Follow-up: Return if symptoms worsen or fail to improve.  Scarlette Calico, MD

## 2016-10-31 NOTE — Patient Instructions (Signed)
Steps to Quit Smoking Smoking tobacco can be bad for your health. It can also affect almost every organ in your body. Smoking puts you and people around you at risk for many serious long-lasting (chronic) diseases. Quitting smoking is hard, but it is one of the best things that you can do for your health. It is never too late to quit. What are the benefits of quitting smoking? When you quit smoking, you lower your risk for getting serious diseases and conditions. They can include:  Lung cancer or lung disease.  Heart disease.  Stroke.  Heart attack.  Not being able to have children (infertility).  Weak bones (osteoporosis) and broken bones (fractures). If you have coughing, wheezing, and shortness of breath, those symptoms may get better when you quit. You may also get sick less often. If you are pregnant, quitting smoking can help to lower your chances of having a baby of low birth weight. What can I do to help me quit smoking? Talk with your doctor about what can help you quit smoking. Some things you can do (strategies) include:  Quitting smoking totally, instead of slowly cutting back how much you smoke over a period of time.  Going to in-person counseling. You are more likely to quit if you go to many counseling sessions.  Using resources and support systems, such as:  Online chats with a counselor.  Phone quitlines.  Printed self-help materials.  Support groups or group counseling.  Text messaging programs.  Mobile phone apps or applications.  Taking medicines. Some of these medicines may have nicotine in them. If you are pregnant or breastfeeding, do not take any medicines to quit smoking unless your doctor says it is okay. Talk with your doctor about counseling or other things that can help you. Talk with your doctor about using more than one strategy at the same time, such as taking medicines while you are also going to in-person counseling. This can help make quitting  easier. What things can I do to make it easier to quit? Quitting smoking might feel very hard at first, but there is a lot that you can do to make it easier. Take these steps:  Talk to your family and friends. Ask them to support and encourage you.  Call phone quitlines, reach out to support groups, or work with a counselor.  Ask people who smoke to not smoke around you.  Avoid places that make you want (trigger) to smoke, such as:  Bars.  Parties.  Smoke-break areas at work.  Spend time with people who do not smoke.  Lower the stress in your life. Stress can make you want to smoke. Try these things to help your stress:  Getting regular exercise.  Deep-breathing exercises.  Yoga.  Meditating.  Doing a body scan. To do this, close your eyes, focus on one area of your body at a time from head to toe, and notice which parts of your body are tense. Try to relax the muscles in those areas.  Download or buy apps on your mobile phone or tablet that can help you stick to your quit plan. There are many free apps, such as QuitGuide from the CDC (Centers for Disease Control and Prevention). You can find more support from smokefree.gov and other websites. This information is not intended to replace advice given to you by your health care provider. Make sure you discuss any questions you have with your health care provider. Document Released: 04/22/2009 Document Revised: 02/22/2016 Document   Reviewed: 11/10/2014 Elsevier Interactive Patient Education  2017 [employer]  

## 2016-11-09 ENCOUNTER — Encounter: Payer: Self-pay | Admitting: Internal Medicine

## 2016-11-11 DIAGNOSIS — F411 Generalized anxiety disorder: Secondary | ICD-10-CM | POA: Diagnosis not present

## 2016-11-11 DIAGNOSIS — F3181 Bipolar II disorder: Secondary | ICD-10-CM | POA: Diagnosis not present

## 2016-11-20 ENCOUNTER — Other Ambulatory Visit: Payer: Self-pay | Admitting: Internal Medicine

## 2016-11-22 NOTE — Telephone Encounter (Signed)
Appears to have been stopped per patient preference. Can you call patient and ask if he wants to resume PREP?

## 2016-11-22 NOTE — Telephone Encounter (Signed)
Pt called giving you a call back.

## 2016-11-22 NOTE — Telephone Encounter (Signed)
Will you advise in PCP absence?

## 2016-12-06 ENCOUNTER — Encounter: Payer: Self-pay | Admitting: Internal Medicine

## 2016-12-19 DIAGNOSIS — F330 Major depressive disorder, recurrent, mild: Secondary | ICD-10-CM | POA: Diagnosis not present

## 2016-12-25 DIAGNOSIS — F330 Major depressive disorder, recurrent, mild: Secondary | ICD-10-CM | POA: Diagnosis not present

## 2017-01-15 ENCOUNTER — Other Ambulatory Visit: Payer: Self-pay | Admitting: Internal Medicine

## 2017-01-30 ENCOUNTER — Encounter: Payer: Self-pay | Admitting: Internal Medicine

## 2017-02-05 ENCOUNTER — Ambulatory Visit: Payer: Self-pay | Admitting: Internal Medicine

## 2017-02-06 ENCOUNTER — Ambulatory Visit (INDEPENDENT_AMBULATORY_CARE_PROVIDER_SITE_OTHER): Payer: BLUE CROSS/BLUE SHIELD | Admitting: Internal Medicine

## 2017-02-06 ENCOUNTER — Other Ambulatory Visit (INDEPENDENT_AMBULATORY_CARE_PROVIDER_SITE_OTHER): Payer: BLUE CROSS/BLUE SHIELD

## 2017-02-06 ENCOUNTER — Encounter: Payer: Self-pay | Admitting: Internal Medicine

## 2017-02-06 VITALS — BP 102/70 | HR 64 | Temp 98.4°F | Resp 16 | Ht 74.45 in | Wt 191.2 lb

## 2017-02-06 DIAGNOSIS — Z7252 High risk homosexual behavior: Secondary | ICD-10-CM

## 2017-02-06 DIAGNOSIS — D72821 Monocytosis (symptomatic): Secondary | ICD-10-CM

## 2017-02-06 DIAGNOSIS — Z79899 Other long term (current) drug therapy: Secondary | ICD-10-CM

## 2017-02-06 LAB — BASIC METABOLIC PANEL
BUN: 14 mg/dL (ref 6–23)
CO2: 30 mEq/L (ref 19–32)
Calcium: 9.3 mg/dL (ref 8.4–10.5)
Chloride: 104 mEq/L (ref 96–112)
Creatinine, Ser: 1.12 mg/dL (ref 0.40–1.50)
GFR: 80.73 mL/min (ref >60.00)
Glucose, Bld: 107 mg/dL — ABNORMAL HIGH (ref 70–99)
Potassium: 4.8 mEq/L (ref 3.5–5.1)
Sodium: 139 mEq/L (ref 135–145)

## 2017-02-06 LAB — CBC WITH DIFFERENTIAL/PLATELET
Basophils Absolute: 0 10*3/uL (ref 0.0–0.1)
Basophils Relative: 0.5 % (ref 0.0–3.0)
Eosinophils Absolute: 0.1 10*3/uL (ref 0.0–0.7)
Eosinophils Relative: 0.9 % (ref 0.0–5.0)
HCT: 43.5 % (ref 39.0–52.0)
Hemoglobin: 14.5 g/dL (ref 13.0–17.0)
Lymphocytes Relative: 28.8 % (ref 12.0–46.0)
Lymphs Abs: 1.9 10*3/uL (ref 0.7–4.0)
MCHC: 33.3 g/dL (ref 30.0–36.0)
MCV: 90.7 fl (ref 78.0–100.0)
Monocytes Absolute: 0.6 10*3/uL (ref 0.1–1.0)
Monocytes Relative: 8.2 % (ref 3.0–12.0)
Neutro Abs: 4.1 10*3/uL (ref 1.4–7.7)
Neutrophils Relative %: 61.6 % (ref 43.0–77.0)
Platelets: 229 10*3/uL (ref 150.0–400.0)
RBC: 4.8 Mil/uL (ref 4.22–5.81)
RDW: 12.1 % (ref 11.5–15.5)
WBC: 6.7 10*3/uL (ref 4.0–10.5)

## 2017-02-06 LAB — URINALYSIS, ROUTINE W REFLEX MICROSCOPIC
Bilirubin Urine: NEGATIVE
Hgb urine dipstick: NEGATIVE
Ketones, ur: NEGATIVE
Leukocytes, UA: NEGATIVE
Nitrite: NEGATIVE
RBC / HPF: NONE SEEN (ref 0–?)
Specific Gravity, Urine: >=1.03 — AB (ref 1.000–1.030)
Total Protein, Urine: NEGATIVE
Urine Glucose: NEGATIVE
Urobilinogen, UA: 0.2 (ref 0.0–1.0)
pH: 5.5 (ref 5.0–8.0)

## 2017-02-06 LAB — HEPATITIS B SURFACE ANTIGEN: Hepatitis B Surface Ag: NONREACTIVE

## 2017-02-06 NOTE — Patient Instructions (Signed)

## 2017-02-06 NOTE — Progress Notes (Signed)
Subjective:  Patient ID: Zachary Becker, male    DOB: April 09, 1986  Age: 31 y.o. MRN: [id number]  CC: Follow-up   HPI [employer] Becker presents for f/up on PrEP. He continues to be sexually active and uses dating [employer] for hookup's. He tells me his sexual practices are safe for the most part. He did have an unprotected encounter about 6 months ago but since then has had no suspicious symptoms. He wants to continue taking PrEP.  Outpatient Medications Prior to Visit  Medication Sig Dispense Refill  . buPROPion (WELLBUTRIN XL) 300 MG 24 hr tablet Take 300 mg by mouth daily.    . TRUVADA 200-300 MG tablet TAKE 1 TABLET BY MOUTH ONCE A DAY. 90 tablet 0  . varenicline (CHANTIX CONTINUING MONTH PAK) 1 MG tablet Take 1 tablet (1 mg total) by mouth 2 (two) times daily. 60 tablet 3  . valACYclovir (VALTREX) 1000 MG tablet Take 1 tablet (1,000 mg total) by mouth 3 (three) times daily. (Patient not taking: Reported on 10/31/2016) 15 tablet 3  . varenicline (CHANTIX STARTING MONTH PAK) 0.5 MG X 11 & 1 MG X 42 tablet Take one 0.5 mg tablet by mouth once daily for 3 days, then increase to one 0.5 mg tablet twice daily for 4 days, then increase to one 1 mg tablet twice daily. 53 tablet 0   No facility-administered medications prior to visit.     ROS Review of Systems  Constitutional: Negative for chills, fatigue and fever.  HENT: Negative.  Negative for sore throat.   Eyes: Negative for visual disturbance.  Respiratory: Negative.  Negative for cough, chest tightness, shortness of breath and wheezing.   Cardiovascular: Negative.  Negative for chest pain, palpitations and leg swelling.  Gastrointestinal: Negative for abdominal pain, constipation, diarrhea, nausea and vomiting.  Endocrine: Negative.   Genitourinary: Negative.  Negative for difficulty urinating, discharge, dysuria, genital sores, penile pain, scrotal swelling and testicular pain.  Musculoskeletal: Negative.  Negative for back  pain and myalgias.  Skin: Negative.  Negative for rash.  Allergic/Immunologic: Negative.   Neurological: Negative.   Hematological: Negative for adenopathy. Does not bruise/bleed easily.  Psychiatric/Behavioral: Negative.     Objective:  BP 102/70 (BP Location: Left Arm, Patient Position: Sitting, Cuff Size: Normal)   Pulse 64   Temp 98.4 F (36.9 C) (Oral)   Resp 16   Ht 6' 2.45" (1.891 m)   Wt 191 lb 4 oz (86.8 kg)   SpO2 99%   BMI 24.26 kg/m   BP Readings from Last 3 Encounters:  02/06/17 102/70  10/31/16 126/80  08/22/16 120/70    Wt Readings from Last 3 Encounters:  02/06/17 191 lb 4 oz (86.8 kg)  10/31/16 188 lb 8 oz (85.5 kg)  08/22/16 184 lb 4 oz (83.6 kg)    Physical Exam  Constitutional: He is oriented to person, place, and time. No distress.  HENT:  Mouth/Throat: Oropharynx is clear and moist. No oropharyngeal exudate.  Eyes: Conjunctivae are normal. Right eye exhibits no discharge. Left eye exhibits no discharge. No scleral icterus.  Neck: Normal range of motion. Neck supple. No JVD present. No thyromegaly present.  Cardiovascular: Normal rate, regular rhythm and intact distal pulses.  Exam reveals no gallop.   No murmur heard. Pulmonary/Chest: Effort normal and breath sounds normal. No respiratory distress. He has no wheezes. He has no rales. He exhibits no tenderness.  Abdominal: Soft. Bowel sounds are normal. He exhibits no distension and no mass.  There is no tenderness. There is no rebound and no guarding.  Musculoskeletal: Normal range of motion. He exhibits no edema, tenderness or deformity.  Lymphadenopathy:    He has no cervical adenopathy.  Neurological: He is alert and oriented to person, place, and time.  Skin: Skin is warm and dry. No rash noted. He is not diaphoretic.  Vitals reviewed.   Lab Results  Component Value Date   WBC 6.7 02/06/2017   HGB 14.5 02/06/2017   HCT 43.5 02/06/2017   PLT 229.0 02/06/2017   GLUCOSE 107 (H) 02/06/2017     CHOL 129 07/24/2016   TRIG 118.0 07/24/2016   HDL 35.60 (L) 07/24/2016   LDLCALC 69 07/24/2016   ALT 29 07/24/2016   AST 24 07/24/2016   NA 139 02/06/2017   K 4.8 02/06/2017   CL 104 02/06/2017   CREATININE 1.12 02/06/2017   BUN 14 02/06/2017   CO2 30 02/06/2017   TSH 0.98 07/24/2016    US Scrotum  Result Date: 07/18/2011 *RADIOLOGY REPORT* Clinical Data:  Left testicular pain and small palpable lump for several months SCROTAL ULTRASOUND DOPPLER ULTRASOUND OF THE TESTICLES Technique: Complete ultrasound examination of the testicles, epididymis, and other scrotal structures was performed.  Color and spectral Doppler ultrasound were also utilized to evaluate blood flow to the testicles. Comparison:  None Findings: Right testis:  The right testicle measures 4.3 x 2.3 x 3.5 cm. Blood flow is demonstrated with arterial and venous waveforms. Left testis:  The left testicle measures 4.5 x 2.7 x 3.2 cm.  Blood flow is demonstrated with arterial and venous waveforms. Right epididymis:  The right epididymis appears normal. Left epididymis:  The left epididymis is normal. Hydocele:  It a small amount of fluid is noted bilaterally. Varicocele:  No hydrocele is seen. The area palpated appears to represent a small extratesticular cystic structure inferior to the left testicle measuring 5 x 3 x 3 mm of doubtful significance. Pulsed Doppler interrogation of both testes demonstrates low resistance flow bilaterally. IMPRESSION: 1.  No intratesticular abnormality.  Blood flow is demonstrated to both testicles. 2.  Small cystic structure extratesticular and  inferior to the left testicle appears to represent the palpable abnormality and is of doubtful clinical significance. 3.  Small amount of fluid bilaterally. Original Report Authenticated By: Joretta Bachelor, M.D.  [place] Art/ven Flow Abd Pelv Doppler  Result Date: 07/18/2011 *RADIOLOGY REPORT* Clinical Data:  Left testicular pain and small palpable lump for several  months SCROTAL ULTRASOUND DOPPLER ULTRASOUND OF THE TESTICLES Technique: Complete ultrasound examination of the testicles, epididymis, and other scrotal structures was performed.  Color and spectral Doppler ultrasound were also utilized to evaluate blood flow to the testicles. Comparison:  None Findings: Right testis:  The right testicle measures 4.3 x 2.3 x 3.5 cm. Blood flow is demonstrated with arterial and venous waveforms. Left testis:  The left testicle measures 4.5 x 2.7 x 3.2 cm.  Blood flow is demonstrated with arterial and venous waveforms. Right epididymis:  The right epididymis appears normal. Left epididymis:  The left epididymis is normal. Hydocele:  It a small amount of fluid is noted bilaterally. Varicocele:  No hydrocele is seen. The area palpated appears to represent a small extratesticular cystic structure inferior to the left testicle measuring 5 x 3 x 3 mm of doubtful significance. Pulsed Doppler interrogation of both testes demonstrates low resistance flow bilaterally. IMPRESSION: 1.  No intratesticular abnormality.  Blood flow is demonstrated to both testicles. 2.  Small cystic  structure extratesticular and  inferior to the left testicle appears to represent the palpable abnormality and is of doubtful clinical significance. 3.  Small amount of fluid bilaterally. Original Report Authenticated By: Joretta Bachelor, M.D.   Assessment & Plan:   Jereld was seen today for follow-up.  Diagnoses and all orders for this visit:  Monocytosis- CBC and differential are normal now, this has resolved  Encounter for long-term (current) use of high-risk medication- his creatinine clearance is above 60 so he can continue to take Truvada -     CBC with Differential/Platelet; Future -     Basic metabolic panel; Future  High risk homosexual behavior- screening for hep B and HIV is negative. He can therefore continue to take Truvada. Urine is negative for any evidence of sexual transmitted infection and  his RPR is negative. -     CBC with Differential/Platelet; Future -     Hepatitis B surface antigen; Future -     Urinalysis, Routine w reflex microscopic; Future -     HIV antibody; Future -     RPR; Future   I am having Mr. Servello maintain his buPROPion, valACYclovir, varenicline, and TRUVADA.  No orders of the defined types were placed in this encounter.    Follow-up: Return in about 6 months (around 08/09/2017).  Scarlette Calico, MD

## 2017-02-07 ENCOUNTER — Encounter: Payer: Self-pay | Admitting: Internal Medicine

## 2017-02-07 LAB — HIV ANTIBODY (ROUTINE TESTING W REFLEX): HIV 1&2 Ab, 4th Generation: NONREACTIVE

## 2017-02-07 LAB — RPR

## 2017-03-19 ENCOUNTER — Other Ambulatory Visit: Payer: Self-pay | Admitting: Internal Medicine

## 2017-03-19 DIAGNOSIS — Z720 Tobacco use: Secondary | ICD-10-CM

## 2017-03-27 ENCOUNTER — Encounter: Payer: Self-pay | Admitting: Internal Medicine

## 2017-03-28 ENCOUNTER — Other Ambulatory Visit: Payer: Self-pay | Admitting: Internal Medicine

## 2017-03-28 DIAGNOSIS — S99911A Unspecified injury of right ankle, initial encounter: Secondary | ICD-10-CM

## 2017-03-30 ENCOUNTER — Encounter: Payer: Self-pay | Admitting: Family Medicine

## 2017-03-30 ENCOUNTER — Ambulatory Visit (INDEPENDENT_AMBULATORY_CARE_PROVIDER_SITE_OTHER): Payer: BLUE CROSS/BLUE SHIELD | Admitting: Family Medicine

## 2017-03-30 DIAGNOSIS — S93491A Sprain of other ligament of right ankle, initial encounter: Secondary | ICD-10-CM

## 2017-03-30 NOTE — Progress Notes (Signed)
Zachary Becker - 31 y.o. male MRN [id number]  Date of birth: 07-23-1985  SUBJECTIVE:  Including CC & ROS.  Chief Complaint  Patient presents with  . Ankle Injury    Patient is here today to F/U after an ankle injury while in [place] on 8.22.18.  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    Mr. Zachary Becker is a 31 yo M that is presenting with right ankle pain and swelling. His injury occurred about 4 weeks ago while he was in [place]. He was walking around the house he was staying and had an inversion injury. He has iced it and had improvement. There is still swelling from time to time. He denies any giving way. He denies repeated ankle sprains. He stands at work most of the day. Symptoms seem to be worse if prolonged standing.    He shows a picture on his phone that shows significant swelling and ecchymosis on the lateral aspect of his right foot when it occurred.   Review of Systems  Constitutional: Negative for fever.  Musculoskeletal: Negative for gait problem and joint swelling.    HISTORY: Past Medical, Surgical, Social, and Family History Reviewed & Updated per EMR.   Pertinent Historical Findings include:  Past Medical History:  Diagnosis Date  . Anxiety   . Depression     No past surgical history on file.  No Known Allergies  Family History  Problem Relation Age of Onset  . Cancer Neg Hx   . Stroke Neg Hx   . Hypertension Neg Hx   . Heart disease Neg Hx   . Depression Neg Hx   . Diabetes Neg Hx   . Early death Neg Hx      Social History   Social History  . Marital status: Single    Spouse name: N/A  . Number of children: N/A  . Years of education: N/A   Occupational History  . Not on file.   Social History Main Topics  . Smoking status: Never Smoker  . Smokeless tobacco: Never Used  . Alcohol use No  . Drug use: No  . Sexual activity: Yes    Birth control/ protection: None   Other Topics Concern  .  Not on file   Social History Narrative  . No narrative on file     PHYSICAL EXAM:  VS: BP 134/86 (BP Location: Right Arm, Patient Position: Sitting, Cuff Size: Normal)   Pulse 91   Temp 97.7 F (36.5 C) (Oral)   Ht 6' 2.45" (1.891 m)   Wt 188 lb (85.3 kg)   SpO2 97%   BMI 23.85 kg/m  Physical Exam Gen: NAD, alert, cooperative with exam, well-appearing ENT: normal lips, normal nasal mucosa,  Eye: normal EOM, normal conjunctiva and lids CV:  no edema, +2 pedal pulses   Resp: no accessory muscle use, non-labored,  Skin: no rashes, no areas of induration  Neuro: normal tone, normal sensation to touch Psych:  normal insight, alert and oriented MSK:  Right ankle:  No obvious effusion or ecchymosis  Normal ankle ROM  Normal strength  Neg anterior drawer Some imbalance with one leg stand on right  Normal gait  Neurovascularly intact       ASSESSMENT & PLAN:   Right ankle sprain He still has some problems with proprioception.  - encourage ROM and strengthening exercises  - compression advised  - if no  improvement in 3-4 consider imaging and referral to PT

## 2017-03-30 NOTE — Patient Instructions (Signed)
Thank you for coming in,   Please follow up with me in 3-4 weeks if this doesn't seem to be getting any better.    Please feel free to call with any questions or concerns at any time, at [phone]. --Dr. Raeford Razor

## 2017-03-31 DIAGNOSIS — S93401A Sprain of unspecified ligament of right ankle, initial encounter: Secondary | ICD-10-CM | POA: Insufficient documentation

## 2017-03-31 NOTE — Assessment & Plan Note (Signed)
He still has some problems with proprioception.  - encourage ROM and strengthening exercises  - compression advised  - if no improvement in 3-4 consider imaging and referral to PT

## 2017-04-11 ENCOUNTER — Encounter: Payer: Self-pay | Admitting: Internal Medicine

## 2017-04-17 ENCOUNTER — Encounter: Payer: Self-pay | Admitting: Internal Medicine

## 2017-04-23 ENCOUNTER — Other Ambulatory Visit: Payer: Self-pay | Admitting: Internal Medicine

## 2017-04-23 DIAGNOSIS — B001 Herpesviral vesicular dermatitis: Secondary | ICD-10-CM

## 2017-04-23 MED ORDER — VALACYCLOVIR HCL 500 MG PO TABS: 500.0000 mg | ORAL_TABLET | Freq: Every day | ORAL | 1 refills | Status: DC

## 2017-05-12 DIAGNOSIS — F411 Generalized anxiety disorder: Secondary | ICD-10-CM | POA: Diagnosis not present

## 2017-06-11 ENCOUNTER — Encounter: Payer: Self-pay | Admitting: Internal Medicine

## 2017-06-12 ENCOUNTER — Other Ambulatory Visit: Payer: Self-pay | Admitting: Internal Medicine

## 2017-07-30 ENCOUNTER — Ambulatory Visit: Payer: Self-pay | Admitting: *Deleted

## 2017-07-30 NOTE — Telephone Encounter (Signed)
Pt called stating that he fell yesterday (down a flight of stairs; pt states that he has been feeling nauseous all day; he says that he has felt fatigued since yesterday; pt also says that he flew home yesterday from [place]; he also states that he did vomit x 1 today which he described as "full force"; his nausea did not start until 1130 today; pt also he has been laying on office floor in preparation to go home; he also reports a headache rated 5 out of 10 which started after he threw up;  nurse triage initiated and per recommendations,  pt instructed to go to ED now; he does not want to call EMS and will have someone drive him; will route to [facility] for notification of this encounter.    Reason for Disposition . Patient sounds very sick or weak to the triager . Can't remember what happened (amnesia)  Answer Assessment - Initial Assessment Questions 1. NAUSEA SEVERITY: "How bad is the nausea?" (e.g., mild, moderate, severe; dehydration, weight loss)   - MILD: loss of appetite without change in eating habits   - MODERATE: decreased oral intake without significant weight loss, dehydration, or malnutrition   - SEVERE: inadequate caloric or fluid intake, significant weight loss, symptoms of dehydration     severe 2. ONSET: "When did the nausea begin?"     1415 3. VOMITING: "Any vomiting?" If so, ask: "How many times today?"   1 4. RECURRENT SYMPTOM: "Have you had nausea before?" If so, ask: "When was the last time?" "What happened that time?"     no 5. CAUSE: "What do you think is causing the nausea?"     Maybe related to fall 6. PREGNANCY: "Is there any chance you are pregnant?" (e.g., unprotected intercourse, missed birth control pill, broken condom)     n/a  Protocols used: NAUSEA-A-AH, HEAD INJURY-A-AH

## 2017-07-31 ENCOUNTER — Ambulatory Visit (INDEPENDENT_AMBULATORY_CARE_PROVIDER_SITE_OTHER): Payer: BLUE CROSS/BLUE SHIELD | Admitting: Family

## 2017-07-31 ENCOUNTER — Encounter: Payer: Self-pay | Admitting: Family

## 2017-07-31 VITALS — BP 90/82 | HR 90 | Temp 98.3°F | Wt 191.0 lb

## 2017-07-31 DIAGNOSIS — A084 Viral intestinal infection, unspecified: Secondary | ICD-10-CM | POA: Diagnosis not present

## 2017-07-31 NOTE — Progress Notes (Signed)
  Zachary Becker is a 32 y.o. male with the following history as recorded in Epic[employer]:  Patient Active Problem List   Diagnosis Date Noted  . Right ankle sprain 03/31/2017  . Injury of right ankle 03/28/2017  . High risk homosexual behavior 02/06/2017  . Tobacco abuse disorder 10/31/2016  . Herpes labialis without complication 42/59/5638  . Encounter for long-term (current) use of high-risk medication 08/22/2016    Current Outpatient Medications  Medication Sig Dispense Refill  . buPROPion (WELLBUTRIN XL) 150 MG 24 hr tablet Take 150 mg by mouth daily.    . CHANTIX CONTINUING MONTH PAK 1 MG tablet TAKE 1 TABLET TWICE DAILY. 60 tablet 1  . TRUVADA 200-300 MG tablet TAKE 1 TABLET BY MOUTH ONCE A DAY. 90 tablet 0  . valACYclovir (VALTREX) 500 MG tablet Take 1 tablet (500 mg total) by mouth daily. 90 tablet 1   No current facility-administered medications for this visit.     Allergies: Patient has no known allergies.  Past Medical History:  Diagnosis Date  . Anxiety   . Depression     No past surgical history on file.  Family History  Problem Relation Age of Onset  . Cancer Neg Hx   . Stroke Neg Hx   . Hypertension Neg Hx   . Heart disease Neg Hx   . Depression Neg Hx   . Diabetes Neg Hx   . Early death Neg Hx     Social History   Tobacco Use  . Smoking status: Never Smoker  . Smokeless tobacco: Never Used  Substance Use Topics  . Alcohol use: No    Alcohol/week: 0.0 oz    Subjective:  Started suddenly last night with nausea/ vomiting/ fever; threw up twice and had sudden onset of body aches; last time ran fever was this morning; has been taking Tylenol; denies any cough, congestion, sore throat; "just want to make sure I don't have the flu." Actually feeling better today;   Objective:  Vitals:   07/31/17 1351  BP: 90/82  Pulse: 90  Temp: 98.3 F (36.8 C)  TempSrc: Oral  SpO2: 98%  Weight: 191 lb 0.6 oz (86.7 kg)    General: Well developed, well  nourished, in no acute distress  Skin : Warm and dry.  Head: Normocephalic and atraumatic  Lungs: Respirations unlabored; clear to auscultation bilaterally without wheeze, rales, rhonchi  CVS exam: normal rate, regular rhythm, normal S1, S2, no murmurs, rubs, clicks or gallops, normal rate and regular rhythm.  Neurologic: Alert and oriented; speech intact; face symmetrical; moves all extremities well; CNII-XII intact without focal deficit  Assessment:  1. Viral gastroenteritis     Plan:  Reassurance that do not feel Tamiflu warranted; increase fluids, rest BRAT diet discussed; work note for yesterday and today; follow-up worse, no better.   No Follow-up on file.  No orders of the defined types were placed in this encounter.   Requested Prescriptions    No prescriptions requested or ordered in this encounter

## 2017-07-31 NOTE — Patient Instructions (Signed)
Food Choices to Help Relieve Diarrhea, Adult  When you have diarrhea, the foods you eat and your eating habits are very important. Choosing the right foods and drinks can help:  · Relieve diarrhea.  · Replace lost fluids and nutrients.  · Prevent dehydration.    What general guidelines should I follow?  Relieving diarrhea  · Choose foods with less than 2 g or .07 oz. of fiber per serving.  · Limit fats to less than 8 tsp (38 g or 1.34 oz.) a day.  · Avoid the following:  ? Foods and beverages sweetened with high-fructose corn syrup, honey, or sugar alcohols such as xylitol, sorbitol, and mannitol.  ? Foods that contain a lot of fat or sugar.  ? Fried, greasy, or spicy foods.  ? High-fiber grains, breads, and cereals.  ? Raw fruits and vegetables.  · Eat foods that are rich in probiotics. These foods include dairy products such as yogurt and fermented milk products. They help increase healthy bacteria in the stomach and intestines (gastrointestinal tract, or GI tract).  · If you have lactose intolerance, avoid dairy products. These may make your diarrhea worse.  · Take medicine to help stop diarrhea (antidiarrheal medicine) only as told by your health care provider.  Replacing nutrients  · Eat small meals or snacks every 3–4 hours.  · Eat bland foods, such as white rice, toast, or baked potato, until your diarrhea starts to get better. Gradually reintroduce nutrient-rich foods as tolerated or as told by your health care provider. This includes:  ? Well-cooked protein foods.  ? Peeled, seeded, and soft-cooked fruits and vegetables.  ? Low-fat dairy products.  · Take vitamin and mineral supplements as told by your health care provider.  Preventing dehydration    · Start by sipping water or a special solution to prevent dehydration (oral rehydration solution, ORS). Urine that is clear or pale yellow means that you are getting enough fluid.  · Try to drink at least 8–10 cups of fluid each day to help replace lost  fluids.  · You may add other liquids in addition to water, such as clear juice or decaffeinated sports drinks, as tolerated or as told by your health care provider.  · Avoid drinks with caffeine, such as coffee, tea, or soft drinks.  · Avoid alcohol.  What foods are recommended?  The items listed may not be a complete list. Talk with your health care provider about what dietary choices are best for you.  Grains  White rice. White, French, or pita breads (fresh or toasted), including plain rolls, buns, or bagels. White pasta. Saltine, soda, or graham crackers. Pretzels. Low-fiber cereal. Cooked cereals made with water (such as cornmeal, farina, or cream cereals). Plain muffins. Matzo. Melba toast. Zwieback.  Vegetables  Potatoes (without the skin). Most well-cooked and canned vegetables without skins or seeds. Tender lettuce.  Fruits  Apple sauce. Fruits canned in juice. Cooked apricots, cherries, grapefruit, peaches, pears, or plums. Fresh bananas and cantaloupe.  Meats and other protein foods  Baked or boiled chicken. Eggs. Tofu. G[place]alian. Seafood. Smooth nut butters. Ground or well-cooked tender beef, ham, veal, lamb, pork, or poultry.  Dairy  Plain yogurt, kefir, and unsweetened liquid yogurt. Lactose-free milk, buttermilk, skim milk, or soy milk. Low-fat or nonfat hard cheese.  Beverages  Water. Low-calorie sports drinks. Fruit juices without pulp. Strained tomato and vegetable juices. Decaffeinated teas. Sugar-free beverages not sweetened with sugar alcohols. Oral rehydration solutions, if approved by your health care   provider.  Seasoning and other foods  Bouillon, broth, or soups made from recommended foods.  What foods are not recommended?  The items listed may not be a complete list. Talk with your health care provider about what dietary choices are best for you.  Grains  Whole grain, whole wheat, bran, or rye breads, rolls, pastas, and crackers. Wild or brown rice. Whole grain or bran cereals. Barley. Oats  and oatmeal. Corn tortillas or taco shells. Granola. Popcorn.  Vegetables  Raw vegetables. Fried vegetables. Cabbage, broccoli, Brussels sprouts, artichokes, baked beans, beet greens, corn, kale, legumes, peas, sweet potatoes, and yams. Potato skins. Cooked spinach and cabbage.  Fruits  Dried fruit, including raisins and dates. Raw fruits. Stewed or dried prunes. Canned fruits with syrup.  Meat and other protein foods  Fried or fatty meats. Deli meats. Chunky nut butters. Nuts and seeds. Beans and lentils. Bacon. Hot dogs. Sausage.  Dairy  High-fat cheeses. Whole milk, chocolate milk, and beverages made with milk, such as milk shakes. Half-and-half. Cream. sour cream. Ice cream.  Beverages  Caffeinated beverages (such as coffee, tea, soda, or energy drinks). Alcoholic beverages. Fruit juices with pulp. Prune juice. Soft drinks sweetened with high-fructose corn syrup or sugar alcohols. High-calorie sports drinks.  Fats and oils  Butter. Cream sauces. Margarine. Salad oils. Plain salad dressings. Olives. Avocados. Mayonnaise.  Sweets and desserts  Sweet rolls, doughnuts, and sweet breads. Sugar-free desserts sweetened with sugar alcohols such as xylitol and sorbitol.  Seasoning and other foods  Honey. Hot sauce. Chili powder. Gravy. Cream-based or milk-based soups. Pancakes and waffles.  Summary  · When you have diarrhea, the foods you eat and your eating habits are very important.  · Make sure you get at least 8–10 cups of fluid each day, or enough to keep your urine clear or pale yellow.  · Eat bland foods and gradually reintroduce healthy, nutrient-rich foods as tolerated, or as told by your health care provider.  · Avoid high-fiber, fried, greasy, or spicy foods.  This information is not intended to replace advice given to you by your health care provider. Make sure you discuss any questions you have with your health care provider.  Document Released: 09/16/2003 Document Revised: 06/23/2016 Document  Reviewed: 06/23/2016  [employer]vier Interactive Patient Education © 2018 [employer]

## 2017-08-22 ENCOUNTER — Encounter: Payer: Self-pay | Admitting: Internal Medicine

## 2017-08-22 ENCOUNTER — Ambulatory Visit (INDEPENDENT_AMBULATORY_CARE_PROVIDER_SITE_OTHER): Payer: BLUE CROSS/BLUE SHIELD | Admitting: Internal Medicine

## 2017-08-22 VITALS — BP 110/70 | HR 100 | Temp 98.5°F | Resp 16 | Ht 74.45 in | Wt 193.2 lb

## 2017-08-22 DIAGNOSIS — F411 Generalized anxiety disorder: Secondary | ICD-10-CM | POA: Diagnosis not present

## 2017-08-22 DIAGNOSIS — F418 Other specified anxiety disorders: Secondary | ICD-10-CM | POA: Diagnosis not present

## 2017-08-22 MED ORDER — VILAZODONE HCL 10 & 20 MG PO KIT: 1.0000 | PACK | Freq: Every day | ORAL | 0 refills | Status: DC

## 2017-08-22 NOTE — Patient Instructions (Signed)
Xanax -  Take one day at bedtime for the next 3 weeks Take one every other day at bedtime for 3 weeks Take one every 3 days at bedtime for 3 weeks. Then stop the xanax   Panic Attack A panic attack is a sudden episode of severe anxiety, fear, or discomfort that causes physical and emotional symptoms. The attack may be in response to something frightening, or it may occur for no known reason. Symptoms of a panic attack can be similar to symptoms of a heart attack or stroke. It is important to see your health care provider when you have a panic attack so that these conditions can be ruled out. A panic attack is a symptom of another condition. Most panic attacks go away with treatment of the underlying problem. If you have panic attacks often, you may have a condition called panic disorder. What are the causes? A panic attack may be caused by:  An extreme, life-threatening situation, such as a war or natural disaster.  An anxiety disorder, such as post-traumatic stress disorder.  Depression.  Certain medical conditions, including heart problems, neurological conditions, and infections.  Certain over-the-counter and prescription medicines.  Illegal drugs that increase heart rate and blood pressure, such as methamphetamine.  Alcohol.  Supplements that increase anxiety.  Panic disorder.  What increases the risk? You are more likely to develop this condition if:  You have an anxiety disorder.  You have another mental health condition.  You take certain medicines.  You use alcohol, illegal drugs, or other substances.  You are under extreme stress.  A life event is causing increased feelings of anxiety and depression.  What are the signs or symptoms? A panic attack starts suddenly, usually lasts about 20 minutes, and occurs with one or more of the following:  A pounding heart.  A feeling that your heart is beating irregularly or faster than normal  (palpitations).  Sweating.  Trembling or shaking.  Shortness of breath or feeling smothered.  Feeling choked.  Chest pain or discomfort.  Nausea or a strange feeling in your stomach.  Dizziness, feeling lightheaded, or feeling like you might faint.  Chills or hot flashes.  Numbness or tingling in your lips, hands, or feet.  Feeling confused, or feeling that you are not yourself.  Fear of losing control or being emotionally unstable.  Fear of dying.  How is this diagnosed? A panic attack is diagnosed with an assessment by your health care provider. During the assessment your health care provider will ask questions about:  Your history of anxiety, depression, and panic attacks.  Your medical history.  Whether you drink alcohol, use illegal drugs, take supplements, or take medicines. Be honest about your substance use.  Your health care provider may also:  Order blood tests or other kinds of tests to rule out serious medical conditions.  Refer you to a mental health professional for further evaluation.  How is this treated? Treatment depends on the cause of the panic attack:  If the cause is a medical problem, your health care provider will either treat that problem or refer you to a specialist.  If the cause is emotional, you may be given anti-anxiety medicines or referred to a counselor. These medicines may reduce how often attacks happen, reduce how severe the attacks are, and lower anxiety.  If the cause is a medicine, your health care provider may tell you to stop the medicine, change your dose, or take a different medicine.  If the  cause is a drug, treatment may involve letting the drug wear off and taking medicine to help the drug leave your body or to counteract its effects. Attacks caused by drug abuse may continue even if you stop using the drug.  Follow these instructions at home:  Take over-the-counter and prescription medicines only as told by your  health care provider.  If you feel anxious, limit your caffeine intake.  Take good care of your physical and mental health by: ? Eating a balanced diet that includes plenty of fresh fruits and vegetables, whole grains, lean meats, and low-fat dairy. ? Getting plenty of rest. Try to get 7-8 hours of uninterrupted sleep each night. ? Exercising regularly. Try to get 30 minutes of physical activity at least 5 days a week. ? Not smoking. Talk to your health care provider if you need help quitting. ? Limiting alcohol intake to no more than 1 drink a day for nonpregnant women and 2 drinks a day for men. One drink equals 12 oz of beer, 5 oz of wine, or 1 oz of hard liquor.  Keep all follow-up visits as told by your health care provider. This is important. Panic attacks may have underlying physical or emotional problems that take time to accurately diagnose. Contact a health care provider if:  Your symptoms do not improve, or they get worse.  You are not able to take your medicine as prescribed because of side effects. Get help right away if:  You have serious thoughts about hurting yourself or others.  You have symptoms of a panic attack. Do not drive yourself to the hospital. Have someone else drive you or call an ambulance. If you ever feel like you may hurt yourself or others, or you have thoughts about taking your own life, get help right away. You can go to your nearest emergency department or call:  Your local emergency services (911 in the [place]  A suicide crisis helpline, such as the [facility] at 680-377-3567. This is open 24 hours a day.  Summary  A panic attack is a sign of a serious health or mental health condition. Get help right away. Do not drive yourself to the hospital. Have someone else drive you or call an ambulance.  Always see a health care provider to have the reasons for the panic attack correctly diagnosed.  If your panic attack was  caused by a physical problem, follow your health care provider's suggestions for medicine, referral to a specialist, and lifestyle changes.  If your panic attack was caused by an emotional problem, follow through with counseling from a qualified mental health specialist.  If you feel like you may hurt yourself or others, call 911 and get help right away. This information is not intended to replace advice given to you by your health care provider. Make sure you discuss any questions you have with your health care provider. Document Released: 06/26/2005 Document Revised: 08/04/2016 Document Reviewed: 08/04/2016 Elsevier Interactive Patient Education  [employer].

## 2017-08-22 NOTE — Progress Notes (Signed)
Subjective:  Patient ID: Zachary Becker, male    DOB: 08-30-1985  Age: 32 y.o. MRN: [id number]  CC: Depression   HPI Zachary Becker presents for concerns about taking xanax.  He is a recovered alcoholic.  He has been seeing a psychiatrist who has prescribed Xanax 0.5 mg twice a day for the last 2 years.  He is uncomfortable being in recovery and taking a benzodiazepine and he wants to start a plan to taper it.  He struggles with chronic insomnia, anxiety, and anhedonia.  He has been taking Wellbutrin to quit smoking.  He has successfully quit smoking.  He wants to stay on Wellbutrin.  Outpatient Medications Prior to Visit  Medication Sig Dispense Refill  . ALPRAZolam (XANAX) 0.5 MG tablet     . buPROPion (WELLBUTRIN XL) 150 MG 24 hr tablet Take 150 mg by mouth daily.    . valACYclovir (VALTREX) 500 MG tablet Take 1 tablet (500 mg total) by mouth daily. 90 tablet 1  . CHANTIX CONTINUING MONTH PAK 1 MG tablet TAKE 1 TABLET TWICE DAILY. 60 tablet 1  . chlordiazePOXIDE (LIBRIUM) 10 MG capsule     . TRUVADA 200-300 MG tablet TAKE 1 TABLET BY MOUTH ONCE A DAY. 90 tablet 0   No facility-administered medications prior to visit.     ROS Review of Systems  Constitutional: Negative for diaphoresis, fatigue and unexpected weight change.  HENT: Negative.   Eyes: Negative.   Respiratory: Negative for chest tightness and shortness of breath.   Cardiovascular: Negative for leg swelling.  Gastrointestinal: Negative for abdominal pain, diarrhea and nausea.  Endocrine: Negative.   Genitourinary: Negative.   Musculoskeletal: Negative.   Skin: Negative.   Hematological: Negative for adenopathy. Does not bruise/bleed easily.  Psychiatric/Behavioral: Positive for dysphoric mood and sleep disturbance. Negative for agitation, behavioral problems, confusion, decreased concentration, self-injury and suicidal ideas. The patient is nervous/anxious.     Objective:  BP 110/70 (BP Location:  Left Arm, Patient Position: Sitting, Cuff Size: Normal)   Pulse 100   Temp 98.5 F (36.9 C) (Oral)   Resp 16   Ht 6' 2.45" (1.891 m)   Wt 193 lb 4 oz (87.7 kg)   SpO2 99%   BMI 24.51 kg/m   BP Readings from Last 3 Encounters:  08/22/17 110/70  07/31/17 90/82  03/30/17 134/86    Wt Readings from Last 3 Encounters:  08/22/17 193 lb 4 oz (87.7 kg)  07/31/17 191 lb 0.6 oz (86.7 kg)  03/30/17 188 lb (85.3 kg)    Physical Exam  Constitutional:  Non-toxic appearance. He does not have a sickly appearance. He does not appear ill. No distress.  Psychiatric: His behavior is normal. Judgment normal. His mood appears anxious. His speech is not rapid and/or pressured, not delayed, not tangential and not slurred. He is not hyperactive, not slowed and not withdrawn. Cognition and memory are normal. He does not exhibit a depressed mood. He expresses no homicidal and no suicidal ideation. He expresses no suicidal plans and no homicidal plans. He is communicative. He is attentive.    Lab Results  Component Value Date   WBC 6.7 02/06/2017   HGB 14.5 02/06/2017   HCT 43.5 02/06/2017   PLT 229.0 02/06/2017   GLUCOSE 107 (H) 02/06/2017   CHOL 129 07/24/2016   TRIG 118.0 07/24/2016   HDL 35.60 (L) 07/24/2016   LDLCALC 69 07/24/2016   ALT 29 07/24/2016   AST 24 07/24/2016   NA 139  02/06/2017   K 4.8 02/06/2017   CL 104 02/06/2017   CREATININE 1.12 02/06/2017   BUN 14 02/06/2017   CO2 30 02/06/2017   TSH 0.98 07/24/2016    US Scrotum  Result Date: 07/18/2011 *RADIOLOGY REPORT* Clinical Data:  Left testicular pain and small palpable lump for several months SCROTAL ULTRASOUND DOPPLER ULTRASOUND OF THE TESTICLES Technique: Complete ultrasound examination of the testicles, epididymis, and other scrotal structures was performed.  Color and spectral Doppler ultrasound were also utilized to evaluate blood flow to the testicles. Comparison:  None Findings: Right testis:  The right testicle measures  4.3 x 2.3 x 3.5 cm. Blood flow is demonstrated with arterial and venous waveforms. Left testis:  The left testicle measures 4.5 x 2.7 x 3.2 cm.  Blood flow is demonstrated with arterial and venous waveforms. Right epididymis:  The right epididymis appears normal. Left epididymis:  The left epididymis is normal. Hydocele:  It a small amount of fluid is noted bilaterally. Varicocele:  No hydrocele is seen. The area palpated appears to represent a small extratesticular cystic structure inferior to the left testicle measuring 5 x 3 x 3 mm of doubtful significance. Pulsed Doppler interrogation of both testes demonstrates low resistance flow bilaterally. IMPRESSION: 1.  No intratesticular abnormality.  Blood flow is demonstrated to both testicles. 2.  Small cystic structure extratesticular and  inferior to the left testicle appears to represent the palpable abnormality and is of doubtful clinical significance. 3.  Small amount of fluid bilaterally. Original Report Authenticated By: Joretta Bachelor, M.D.  [place] Art/ven Flow Abd Pelv Doppler  Result Date: 07/18/2011 *RADIOLOGY REPORT* Clinical Data:  Left testicular pain and small palpable lump for several months SCROTAL ULTRASOUND DOPPLER ULTRASOUND OF THE TESTICLES Technique: Complete ultrasound examination of the testicles, epididymis, and other scrotal structures was performed.  Color and spectral Doppler ultrasound were also utilized to evaluate blood flow to the testicles. Comparison:  None Findings: Right testis:  The right testicle measures 4.3 x 2.3 x 3.5 cm. Blood flow is demonstrated with arterial and venous waveforms. Left testis:  The left testicle measures 4.5 x 2.7 x 3.2 cm.  Blood flow is demonstrated with arterial and venous waveforms. Right epididymis:  The right epididymis appears normal. Left epididymis:  The left epididymis is normal. Hydocele:  It a small amount of fluid is noted bilaterally. Varicocele:  No hydrocele is seen. The area palpated appears  to represent a small extratesticular cystic structure inferior to the left testicle measuring 5 x 3 x 3 mm of doubtful significance. Pulsed Doppler interrogation of both testes demonstrates low resistance flow bilaterally. IMPRESSION: 1.  No intratesticular abnormality.  Blood flow is demonstrated to both testicles. 2.  Small cystic structure extratesticular and  inferior to the left testicle appears to represent the palpable abnormality and is of doubtful clinical significance. 3.  Small amount of fluid bilaterally. Original Report Authenticated By: Joretta Bachelor, M.D.   Assessment & Plan:   Zachary Becker was seen today for depression.  Diagnoses and all orders for this visit:  GAD (generalized anxiety disorder)- He will start the Xanax taper as detailed in his patient instructions.  It will be about a 9-week process. -     Vilazodone HCl (VIIBRYD STARTER PACK) 10 & 20 MG KIT; Take 1 tablet by mouth daily.  Anxiety with depression- I have asked him to start taking Viibryd.  I encouraged him to take this with food.  He will let me know in 2 weeks  whether or not he wants to increase from the 20 mg dose to the 40 mg dose. -     Vilazodone HCl (VIIBRYD STARTER PACK) 10 & 20 MG KIT; Take 1 tablet by mouth daily.   I have discontinued Zachary T. Harbeson Becker "Tal"'s TRUVADA, CHANTIX CONTINUING MONTH PAK, and chlordiazePOXIDE. I am also having him start on Vilazodone HCl. Additionally, I am having him maintain his buPROPion, valACYclovir, and ALPRAZolam.  Meds ordered this encounter  Medications  . Vilazodone HCl (VIIBRYD STARTER PACK) 10 & 20 MG KIT    Sig: Take 1 tablet by mouth daily.    Dispense:  1 kit    Refill:  0     Follow-up: Return in about 2 weeks (around 09/05/2017).  Scarlette Calico, MD

## 2017-08-23 ENCOUNTER — Encounter: Payer: Self-pay | Admitting: Internal Medicine

## 2017-08-23 NOTE — Telephone Encounter (Signed)
Alternative to Viibryd:  Citalopram Hydrobromide Escitalopram Oxalate FLUoxetine HCl PARoxetine HCl Sertraline HCl DULoxetine HCl Venlafaxine HCl ER   PA for Viibryd 20 mg tablet started.  KEY: [id number]

## 2017-08-29 ENCOUNTER — Other Ambulatory Visit: Payer: Self-pay | Admitting: Internal Medicine

## 2017-08-29 DIAGNOSIS — F418 Other specified anxiety disorders: Secondary | ICD-10-CM

## 2017-08-29 MED ORDER — VILAZODONE HCL 20 MG PO TABS: 1.0000 | ORAL_TABLET | Freq: Every day | ORAL | 0 refills | Status: DC

## 2017-09-26 ENCOUNTER — Other Ambulatory Visit: Payer: Self-pay | Admitting: Internal Medicine

## 2017-09-26 DIAGNOSIS — B001 Herpesviral vesicular dermatitis: Secondary | ICD-10-CM

## 2017-10-03 ENCOUNTER — Other Ambulatory Visit: Payer: Self-pay | Admitting: Internal Medicine

## 2017-10-03 ENCOUNTER — Encounter: Payer: Self-pay | Admitting: Internal Medicine

## 2017-10-03 DIAGNOSIS — F418 Other specified anxiety disorders: Secondary | ICD-10-CM

## 2017-10-03 DIAGNOSIS — F411 Generalized anxiety disorder: Secondary | ICD-10-CM

## 2017-10-03 MED ORDER — TRAZODONE HCL 50 MG PO TABS: 50.0000 mg | ORAL_TABLET | Freq: Every evening | ORAL | 1 refills | Status: DC | PRN

## 2017-10-22 ENCOUNTER — Encounter: Payer: Self-pay | Admitting: Internal Medicine

## 2017-11-06 ENCOUNTER — Encounter: Payer: Self-pay | Admitting: Internal Medicine

## 2017-11-06 ENCOUNTER — Ambulatory Visit (INDEPENDENT_AMBULATORY_CARE_PROVIDER_SITE_OTHER): Payer: BLUE CROSS/BLUE SHIELD | Admitting: Internal Medicine

## 2017-11-06 VITALS — BP 120/70 | HR 90 | Temp 98.6°F | Resp 16 | Ht 74.45 in | Wt 195.5 lb

## 2017-11-06 DIAGNOSIS — F418 Other specified anxiety disorders: Secondary | ICD-10-CM | POA: Diagnosis not present

## 2017-11-06 DIAGNOSIS — F411 Generalized anxiety disorder: Secondary | ICD-10-CM

## 2017-11-06 DIAGNOSIS — D229 Melanocytic nevi, unspecified: Secondary | ICD-10-CM

## 2017-11-06 NOTE — Patient Instructions (Signed)
Major Depressive Disorder, Adult Major depressive disorder (MDD) is a mental health condition. It may also be called clinical depression or unipolar depression. MDD usually causes feelings of sadness, hopelessness, or helplessness. MDD can also cause physical symptoms. It can interfere with work, school, relationships, and other everyday activities. MDD may be mild, moderate, or severe. It may occur once (single episode major depressive disorder) or it may occur multiple times (recurrent major depressive disorder). What are the causes? The exact cause of this condition is not known. MDD is most likely caused by a combination of things, which may include:  Genetic factors. These are traits that are passed along from parent to child.  Individual factors. Your personality, your behavior, and the way you handle your thoughts and feelings may contribute to MDD. This includes personality traits and behaviors learned from others.  Physical factors, such as: ? Differences in the part of your brain that controls emotion. This part of your brain may be different than it is in people who do not have MDD. ? Long-term (chronic) medical or psychiatric illnesses.  Social factors. Traumatic experiences or major life changes may play a role in the development of MDD.  What increases the risk? This condition is more likely to develop in women. The following factors may also make you more likely to develop MDD:  A family history of depression.  Troubled family relationships.  Abnormally low levels of certain brain chemicals.  Traumatic events in childhood, especially abuse or the loss of a parent.  Being under a lot of stress, or long-term stress, especially from upsetting life experiences or losses.  A history of: ? Chronic physical illness. ? Other mental health disorders. ? Substance abuse.  Poor living conditions.  Experiencing social exclusion or discrimination on a regular basis.  What are  the signs or symptoms? The main symptoms of MDD typically include:  Constant depressed or irritable mood.  Loss of interest in things and activities.  MDD symptoms may also include:  Sleeping or eating too much or too little.  Unexplained weight change.  Fatigue or low energy.  Feelings of worthlessness or guilt.  Difficulty thinking clearly or making decisions.  Thoughts of suicide or of harming others.  Physical agitation or weakness.  Isolation.  Severe cases of MDD may also occur with other symptoms, such as:  Delusions or hallucinations, in which you imagine things that are not real (psychotic depression).  Low-level depression that lasts at least a year (chronic depression or persistent depressive disorder).  Extreme sadness and hopelessness (melancholic depression).  Trouble speaking and moving (catatonic depression).  How is this diagnosed? This condition may be diagnosed based on:  Your symptoms.  Your medical history, including your mental health history. This may involve tests to evaluate your mental health. You may be asked questions about your lifestyle, including any drug and alcohol use, and how long you have had symptoms of MDD.  A physical exam.  Blood tests to rule out other conditions.  You must have a depressed mood and at least four other MDD symptoms most of the day, nearly every day in the same 2-week timeframe before your health care provider can confirm a diagnosis of MDD. How is this treated? This condition is usually treated by mental health professionals, such as psychologists, psychiatrists, and clinical social workers. You may need more than one type of treatment. Treatment may include:  Psychotherapy. This is also called talk therapy or counseling. Types of psychotherapy include: ? Cognitive behavioral   therapy (CBT). This type of therapy teaches you to recognize unhealthy feelings, thoughts, and behaviors, and replace them with  positive thoughts and actions. ? Interpersonal therapy (IPT). This helps you to improve the way you relate to and communicate with others. ? Family therapy. This treatment includes members of your family.  Medicine to treat anxiety and depression, or to help you control certain emotions and behaviors.  Lifestyle changes, such as: ? Limiting alcohol and drug use. ? Exercising regularly. ? Getting plenty of sleep. ? Making healthy eating choices. ? Spending more time outdoors.  Treatments involving stimulation of the brain can be used in situations with extremely severe symptoms, or when medicine or other therapies do not work over time. These treatments include electroconvulsive therapy, transcranial magnetic stimulation, and vagal nerve stimulation. Follow these instructions at home: Activity  Return to your normal activities as told by your health care provider.  Exercise regularly and spend time outdoors as told by your health care provider. General instructions  Take over-the-counter and prescription medicines only as told by your health care provider.  Do not drink alcohol. If you drink alcohol, limit your alcohol intake to no more than 1 drink a day for nonpregnant women and 2 drinks a day for men. One drink equals 12 oz of beer, 5 oz of wine, or 1 oz of hard liquor. Alcohol can affect any antidepressant medicines you are taking. Talk to your health care provider about your alcohol use.  Eat a healthy diet and get plenty of sleep.  Find activities that you enjoy doing, and make time to do them.  Consider joining a support group. Your health care provider may be able to recommend a support group.  Keep all follow-up visits as told by your health care provider. This is important. Where to find more information: National Alliance on Mental Illness  www.nami.org  [place] National Institute of Mental Health  www.nimh.nih.gov  National Suicide Prevention  [employer]  1-800-273-TALK (8255). This is free, 24-hour help.  Contact a health care provider if:  Your symptoms get worse.  You develop new symptoms. Get help right away if:  You self-harm.  You have serious thoughts about hurting yourself or others.  You see, hear, taste, smell, or feel things that are not present (hallucinate). This information is not intended to replace advice given to you by your health care provider. Make sure you discuss any questions you have with your health care provider. Document Released: 10/21/2012 Document Revised: 03/02/2016 Document Reviewed: 01/05/2016 Elsevier Interactive Patient Education  2018 [employer]  

## 2017-11-06 NOTE — Progress Notes (Signed)
Subjective:  Patient ID: Zachary Becker, male    DOB: 1986-03-08  Age: 32 y.o. MRN: [id number]  CC: Depression   HPI Zachary Becker presents for f/up -since I last saw him he has been able to taper off of Xanax.  His last dose was 2 weeks ago.  He is also taking Viibryd, partly because he does not think he needs it and also because it was causing nausea and headache.  For now, he wants to continue Wellbutrin and trazodone but he may consider tapering off those in the future as well.  He also wants to have a full skin examination today.  He has a history of multiple benign moles but none that have recently concerned him with changes or symptoms.  He does not have a family history of melanoma.  Outpatient Medications Prior to Visit  Medication Sig Dispense Refill  . buPROPion (WELLBUTRIN XL) 150 MG 24 hr tablet Take 150 mg by mouth daily.    . traZODone (DESYREL) 50 MG tablet Take 1 tablet (50 mg total) by mouth at bedtime as needed for sleep. 90 tablet 1  . valACYclovir (VALTREX) 500 MG tablet TAKE 1 TABLET ONCE DAILY. 90 tablet 2  . Vilazodone HCl (VIIBRYD) 20 MG TABS Take 1 tablet (20 mg total) by mouth daily. 90 tablet 0  . ALPRAZolam (XANAX) 0.5 MG tablet      No facility-administered medications prior to visit.     ROS Review of Systems  Skin: Negative for color change, pallor and rash.  Psychiatric/Behavioral: Positive for sleep disturbance. Negative for agitation, confusion, decreased concentration, dysphoric mood, self-injury and suicidal ideas. The patient is nervous/anxious. The patient is not hyperactive.   All other systems reviewed and are negative.   Objective:  BP 120/70 (BP Location: Left Arm, Patient Position: Sitting, Cuff Size: Normal)   Pulse 90   Temp 98.6 F (37 C) (Oral)   Resp 16   Ht 6' 2.45" (1.891 m)   Wt 195 lb 8 oz (88.7 kg)   SpO2 97%   BMI 24.80 kg/m   BP Readings from Last 3 Encounters:  11/06/17 120/70  08/22/17 110/70    07/31/17 90/82    Wt Readings from Last 3 Encounters:  11/06/17 195 lb 8 oz (88.7 kg)  08/22/17 193 lb 4 oz (87.7 kg)  07/31/17 191 lb 0.6 oz (86.7 kg)    Physical Exam  Constitutional: He is oriented to person, place, and time. No distress.  HENT:  Mouth/Throat: Oropharynx is clear and moist. No oropharyngeal exudate.  Eyes: Conjunctivae are normal. No scleral icterus.  Neck: Normal range of motion. Neck supple. No JVD present.  Cardiovascular: Normal rate, regular rhythm and normal heart sounds. Exam reveals no gallop and no friction rub.  No murmur heard. Pulmonary/Chest: Effort normal and breath sounds normal. No stridor. No respiratory distress. He has no wheezes. He has no rales.  Abdominal: Soft. Bowel sounds are normal. He exhibits no mass. There is no tenderness. Hernia confirmed negative in the right inguinal area and confirmed negative in the left inguinal area.  Genitourinary: Testes normal and penis normal. Right testis shows no mass, no swelling and no tenderness. Left testis shows no mass, no swelling and no tenderness. No penile erythema or penile tenderness. No discharge found.  Lymphadenopathy: No inguinal adenopathy noted on the right or left side.  Neurological: He is alert and oriented to person, place, and time.  Skin: Skin is warm and dry. He  is not diaphoretic.  Full skin examination reveals multiple small benign-appearing nevi.  There are none that are greater than a few millimeters.  None of them show any atypical features such as asymmetry, irregular borders, abnormal pigmentation, or halos.  Vitals reviewed.   Lab Results  Component Value Date   WBC 6.7 02/06/2017   HGB 14.5 02/06/2017   HCT 43.5 02/06/2017   PLT 229.0 02/06/2017   GLUCOSE 107 (H) 02/06/2017   CHOL 129 07/24/2016   TRIG 118.0 07/24/2016   HDL 35.60 (L) 07/24/2016   LDLCALC 69 07/24/2016   ALT 29 07/24/2016   AST 24 07/24/2016   NA 139 02/06/2017   K 4.8 02/06/2017   CL 104  02/06/2017   CREATININE 1.12 02/06/2017   BUN 14 02/06/2017   CO2 30 02/06/2017   TSH 0.98 07/24/2016    US Scrotum  Result Date: 07/18/2011 *RADIOLOGY REPORT* Clinical Data:  Left testicular pain and small palpable lump for several months SCROTAL ULTRASOUND DOPPLER ULTRASOUND OF THE TESTICLES Technique: Complete ultrasound examination of the testicles, epididymis, and other scrotal structures was performed.  Color and spectral Doppler ultrasound were also utilized to evaluate blood flow to the testicles. Comparison:  None Findings: Right testis:  The right testicle measures 4.3 x 2.3 x 3.5 cm. Blood flow is demonstrated with arterial and venous waveforms. Left testis:  The left testicle measures 4.5 x 2.7 x 3.2 cm.  Blood flow is demonstrated with arterial and venous waveforms. Right epididymis:  The right epididymis appears normal. Left epididymis:  The left epididymis is normal. Hydocele:  It a small amount of fluid is noted bilaterally. Varicocele:  No hydrocele is seen. The area palpated appears to represent a small extratesticular cystic structure inferior to the left testicle measuring 5 x 3 x 3 mm of doubtful significance. Pulsed Doppler interrogation of both testes demonstrates low resistance flow bilaterally. IMPRESSION: 1.  No intratesticular abnormality.  Blood flow is demonstrated to both testicles. 2.  Small cystic structure extratesticular and  inferior to the left testicle appears to represent the palpable abnormality and is of doubtful clinical significance. 3.  Small amount of fluid bilaterally. Original Report Authenticated By: Joretta Bachelor, M.D.  [place] Art/ven Flow Abd Pelv Doppler  Result Date: 07/18/2011 *RADIOLOGY REPORT* Clinical Data:  Left testicular pain and small palpable lump for several months SCROTAL ULTRASOUND DOPPLER ULTRASOUND OF THE TESTICLES Technique: Complete ultrasound examination of the testicles, epididymis, and other scrotal structures was performed.  Color and  spectral Doppler ultrasound were also utilized to evaluate blood flow to the testicles. Comparison:  None Findings: Right testis:  The right testicle measures 4.3 x 2.3 x 3.5 cm. Blood flow is demonstrated with arterial and venous waveforms. Left testis:  The left testicle measures 4.5 x 2.7 x 3.2 cm.  Blood flow is demonstrated with arterial and venous waveforms. Right epididymis:  The right epididymis appears normal. Left epididymis:  The left epididymis is normal. Hydocele:  It a small amount of fluid is noted bilaterally. Varicocele:  No hydrocele is seen. The area palpated appears to represent a small extratesticular cystic structure inferior to the left testicle measuring 5 x 3 x 3 mm of doubtful significance. Pulsed Doppler interrogation of both testes demonstrates low resistance flow bilaterally. IMPRESSION: 1.  No intratesticular abnormality.  Blood flow is demonstrated to both testicles. 2.  Small cystic structure extratesticular and  inferior to the left testicle appears to represent the palpable abnormality and is of doubtful clinical significance.  3.  Small amount of fluid bilaterally. Original Report Authenticated By: Joretta Bachelor, M.D.   Assessment & Plan:   Zachary Becker was seen today for depression.  Diagnoses and all orders for this visit:  Anxiety with depression- He agrees to taper off of Wellbutrin over the next few weeks.  He will take it every other day for about 2 weeks and then every third day for about 2 weeks and then try to discontinue it.  After that he will get in touch with me and we will consider tapering off the trazodone.  GAD (generalized anxiety disorder)- Improvement noted  Benign nevus without atypia - I offered him reassurance today that none of his nevi appear suspicious.  He tells me he has an appointment with a dermatologist in about 3 months for a second opinion.   I have discontinued Zachary T. Sax Becker "Tal"'s ALPRAZolam and Vilazodone HCl. I am also having him  maintain his buPROPion, valACYclovir, and traZODone.  No orders of the defined types were placed in this encounter.    Follow-up: Return if symptoms worsen or fail to improve.  Scarlette Calico, MD

## 2017-11-07 DIAGNOSIS — D229 Melanocytic nevi, unspecified: Secondary | ICD-10-CM | POA: Insufficient documentation

## 2017-12-20 DIAGNOSIS — L814 Other melanin hyperpigmentation: Secondary | ICD-10-CM | POA: Diagnosis not present

## 2017-12-20 DIAGNOSIS — L700 Acne vulgaris: Secondary | ICD-10-CM | POA: Diagnosis not present

## 2017-12-20 DIAGNOSIS — D225 Melanocytic nevi of trunk: Secondary | ICD-10-CM | POA: Diagnosis not present

## 2017-12-20 DIAGNOSIS — L218 Other seborrheic dermatitis: Secondary | ICD-10-CM | POA: Diagnosis not present

## 2017-12-21 DIAGNOSIS — F411 Generalized anxiety disorder: Secondary | ICD-10-CM | POA: Diagnosis not present

## 2018-01-01 ENCOUNTER — Encounter: Payer: Self-pay | Admitting: Internal Medicine

## 2018-02-13 ENCOUNTER — Encounter: Payer: Self-pay | Admitting: Internal Medicine

## 2018-02-13 ENCOUNTER — Ambulatory Visit (INDEPENDENT_AMBULATORY_CARE_PROVIDER_SITE_OTHER): Payer: BLUE CROSS/BLUE SHIELD | Admitting: Internal Medicine

## 2018-02-13 VITALS — BP 128/80 | HR 89 | Temp 99.0°F | Resp 16 | Ht 74.45 in | Wt 197.5 lb

## 2018-02-13 DIAGNOSIS — F341 Dysthymic disorder: Secondary | ICD-10-CM | POA: Insufficient documentation

## 2018-02-13 MED ORDER — LAMOTRIGINE ER 50 MG PO TB24: 1.0000 | ORAL_TABLET | Freq: Every day | ORAL | 0 refills | Status: DC

## 2018-02-13 NOTE — Progress Notes (Signed)
Subjective:  Patient ID: Zachary Becker, male    DOB: 06-09-1986  Age: 32 y.o. MRN: [id number]  CC: Depression   HPI Zachary Becker presents for f/up - He tells me he saw a psychiatrist about 2 months ago complaining of insomnia and was placed on gabapentin.  His sleep has improved.  Over the years he has tried multiple antidepressants. He responds well to some, like SSRIs, but they cause rather severe ED and anorgasmia so he eventually stopped taking them.  He also tried Wellbutrin but felt it was too stimulating and contributed to anxiety and panic.  He continues to feel, as he describes, irritable, fixated on certain things as though he has OCD, and stuck on ruminations about the past and present wrongs.  He tells me that many years ago someone told him he might be bipolar type II so he tried Lamictal for a while.  He says he thought it helped him back then but at that time he was also drinking.  He does not drink anymore and has quit smoking but he wants to try Lamictal again to see if he would benefit from taking a mood stabilizer.  Outpatient Medications Prior to Visit  Medication Sig Dispense Refill  . gabapentin (NEURONTIN) 600 MG tablet     . valACYclovir (VALTREX) 500 MG tablet TAKE 1 TABLET ONCE DAILY. 90 tablet 2  . traZODone (DESYREL) 50 MG tablet Take 1 tablet (50 mg total) by mouth at bedtime as needed for sleep. 90 tablet 1  . buPROPion (WELLBUTRIN XL) 150 MG 24 hr tablet Take 150 mg by mouth daily.     No facility-administered medications prior to visit.     ROS Review of Systems  Constitutional: Negative for diaphoresis, fatigue and unexpected weight change.  HENT: Negative.   Respiratory: Negative.   Cardiovascular: Negative.   Gastrointestinal: Negative.  Negative for abdominal pain.  Endocrine: Negative.   Genitourinary: Negative.   Musculoskeletal: Negative.   Skin: Negative.   Hematological: Negative for adenopathy. Does not bruise/bleed easily.    Psychiatric/Behavioral: Positive for dysphoric mood and sleep disturbance. Negative for agitation, behavioral problems, confusion, decreased concentration, hallucinations, self-injury and suicidal ideas. The patient is nervous/anxious. The patient is not hyperactive.     Objective:  BP 128/80 (BP Location: Left Arm, Patient Position: Sitting, Cuff Size: Normal)   Pulse 89   Temp 99 F (37.2 C) (Oral)   Resp 16   Ht 6' 2.45" (1.891 m)   Wt 197 lb 8 oz (89.6 kg)   SpO2 96%   BMI 25.05 kg/m   BP Readings from Last 3 Encounters:  02/13/18 128/80  11/06/17 120/70  08/22/17 110/70    Wt Readings from Last 3 Encounters:  02/13/18 197 lb 8 oz (89.6 kg)  11/06/17 195 lb 8 oz (88.7 kg)  08/22/17 193 lb 4 oz (87.7 kg)    Physical Exam  Psychiatric: His behavior is normal. Judgment and thought content normal. His mood appears anxious. His affect is not inappropriate. His speech is not rapid and/or pressured, not delayed, not tangential and not slurred. He is not agitated, not aggressive and not hyperactive. Thought content is not paranoid and not delusional. Cognition and memory are normal. He does not exhibit a depressed mood. He expresses no homicidal and no suicidal ideation. He expresses no suicidal plans and no homicidal plans. He is communicative.    Lab Results  Component Value Date   WBC 6.7 02/06/2017   HGB  14.5 02/06/2017   HCT 43.5 02/06/2017   PLT 229.0 02/06/2017   GLUCOSE 107 (H) 02/06/2017   CHOL 129 07/24/2016   TRIG 118.0 07/24/2016   HDL 35.60 (L) 07/24/2016   LDLCALC 69 07/24/2016   ALT 29 07/24/2016   AST 24 07/24/2016   NA 139 02/06/2017   K 4.8 02/06/2017   CL 104 02/06/2017   CREATININE 1.12 02/06/2017   BUN 14 02/06/2017   CO2 30 02/06/2017   TSH 0.98 07/24/2016    US Scrotum  Result Date: 07/18/2011 *RADIOLOGY REPORT* Clinical Data:  Left testicular pain and small palpable lump for several months SCROTAL ULTRASOUND DOPPLER ULTRASOUND OF THE  TESTICLES Technique: Complete ultrasound examination of the testicles, epididymis, and other scrotal structures was performed.  Color and spectral Doppler ultrasound were also utilized to evaluate blood flow to the testicles. Comparison:  None Findings: Right testis:  The right testicle measures 4.3 x 2.3 x 3.5 cm. Blood flow is demonstrated with arterial and venous waveforms. Left testis:  The left testicle measures 4.5 x 2.7 x 3.2 cm.  Blood flow is demonstrated with arterial and venous waveforms. Right epididymis:  The right epididymis appears normal. Left epididymis:  The left epididymis is normal. Hydocele:  It a small amount of fluid is noted bilaterally. Varicocele:  No hydrocele is seen. The area palpated appears to represent a small extratesticular cystic structure inferior to the left testicle measuring 5 x 3 x 3 mm of doubtful significance. Pulsed Doppler interrogation of both testes demonstrates low resistance flow bilaterally. IMPRESSION: 1.  No intratesticular abnormality.  Blood flow is demonstrated to both testicles. 2.  Small cystic structure extratesticular and  inferior to the left testicle appears to represent the palpable abnormality and is of doubtful clinical significance. 3.  Small amount of fluid bilaterally. Original Report Authenticated By: Joretta Bachelor, M.D.  [place] Art/ven Flow Abd Pelv Doppler  Result Date: 07/18/2011 *RADIOLOGY REPORT* Clinical Data:  Left testicular pain and small palpable lump for several months SCROTAL ULTRASOUND DOPPLER ULTRASOUND OF THE TESTICLES Technique: Complete ultrasound examination of the testicles, epididymis, and other scrotal structures was performed.  Color and spectral Doppler ultrasound were also utilized to evaluate blood flow to the testicles. Comparison:  None Findings: Right testis:  The right testicle measures 4.3 x 2.3 x 3.5 cm. Blood flow is demonstrated with arterial and venous waveforms. Left testis:  The left testicle measures 4.5 x 2.7 x  3.2 cm.  Blood flow is demonstrated with arterial and venous waveforms. Right epididymis:  The right epididymis appears normal. Left epididymis:  The left epididymis is normal. Hydocele:  It a small amount of fluid is noted bilaterally. Varicocele:  No hydrocele is seen. The area palpated appears to represent a small extratesticular cystic structure inferior to the left testicle measuring 5 x 3 x 3 mm of doubtful significance. Pulsed Doppler interrogation of both testes demonstrates low resistance flow bilaterally. IMPRESSION: 1.  No intratesticular abnormality.  Blood flow is demonstrated to both testicles. 2.  Small cystic structure extratesticular and  inferior to the left testicle appears to represent the palpable abnormality and is of doubtful clinical significance. 3.  Small amount of fluid bilaterally. Original Report Authenticated By: Joretta Bachelor, M.D.   Assessment & Plan:   Caprice was seen today for depression.  Diagnoses and all orders for this visit:  Dysthymia (or depressive neurosis)- Will try a mood stabilizer. Will start at a low dose and will increase based on his response. -  Discontinue: LamoTRIgine 50 MG TB24 24 hour tablet; Take 1 tablet (50 mg total) by mouth daily. -     lamoTRIgine (LAMICTAL) 25 MG tablet; Take 1 tablet (25 mg total) by mouth 2 (two) times daily.   I have discontinued Angell T. Leidy Becker "Zachary Becker"'s buPROPion, traZODone, and LamoTRIgine. I am also having him start on lamoTRIgine. Additionally, I am having him maintain his valACYclovir and gabapentin.  Meds ordered this encounter  Medications  . DISCONTD: LamoTRIgine 50 MG TB24 24 hour tablet    Sig: Take 1 tablet (50 mg total) by mouth daily.    Dispense:  30 tablet    Refill:  0  . lamoTRIgine (LAMICTAL) 25 MG tablet    Sig: Take 1 tablet (25 mg total) by mouth 2 (two) times daily.    Dispense:  180 tablet    Refill:  0     Follow-up: No follow-ups on file.  Scarlette Calico, MD

## 2018-02-14 ENCOUNTER — Encounter: Payer: Self-pay | Admitting: Internal Medicine

## 2018-02-14 MED ORDER — LAMOTRIGINE 25 MG PO TABS: 25.0000 mg | ORAL_TABLET | Freq: Two times a day (BID) | ORAL | 0 refills | Status: DC

## 2018-02-14 NOTE — Patient Instructions (Signed)
Major Depressive Disorder, Adult Major depressive disorder (MDD) is a mental health condition. It may also be called clinical depression or unipolar depression. MDD usually causes feelings of sadness, hopelessness, or helplessness. MDD can also cause physical symptoms. It can interfere with work, school, relationships, and other everyday activities. MDD may be mild, moderate, or severe. It may occur once (single episode major depressive disorder) or it may occur multiple times (recurrent major depressive disorder). What are the causes? The exact cause of this condition is not known. MDD is most likely caused by a combination of things, which may include:  Genetic factors. These are traits that are passed along from parent to child.  Individual factors. Your personality, your behavior, and the way you handle your thoughts and feelings may contribute to MDD. This includes personality traits and behaviors learned from others.  Physical factors, such as: ? Differences in the part of your brain that controls emotion. This part of your brain may be different than it is in people who do not have MDD. ? Long-term (chronic) medical or psychiatric illnesses.  Social factors. Traumatic experiences or major life changes may play a role in the development of MDD.  What increases the risk? This condition is more likely to develop in women. The following factors may also make you more likely to develop MDD:  A family history of depression.  Troubled family relationships.  Abnormally low levels of certain brain chemicals.  Traumatic events in childhood, especially abuse or the loss of a parent.  Being under a lot of stress, or long-term stress, especially from upsetting life experiences or losses.  A history of: ? Chronic physical illness. ? Other mental health disorders. ? Substance abuse.  Poor living conditions.  Experiencing social exclusion or discrimination on a regular basis.  What are  the signs or symptoms? The main symptoms of MDD typically include:  Constant depressed or irritable mood.  Loss of interest in things and activities.  MDD symptoms may also include:  Sleeping or eating too much or too little.  Unexplained weight change.  Fatigue or low energy.  Feelings of worthlessness or guilt.  Difficulty thinking clearly or making decisions.  Thoughts of suicide or of harming others.  Physical agitation or weakness.  Isolation.  Severe cases of MDD may also occur with other symptoms, such as:  Delusions or hallucinations, in which you imagine things that are not real (psychotic depression).  Low-level depression that lasts at least a year (chronic depression or persistent depressive disorder).  Extreme sadness and hopelessness (melancholic depression).  Trouble speaking and moving (catatonic depression).  How is this diagnosed? This condition may be diagnosed based on:  Your symptoms.  Your medical history, including your mental health history. This may involve tests to evaluate your mental health. You may be asked questions about your lifestyle, including any drug and alcohol use, and how long you have had symptoms of MDD.  A physical exam.  Blood tests to rule out other conditions.  You must have a depressed mood and at least four other MDD symptoms most of the day, nearly every day in the same 2-week timeframe before your health care provider can confirm a diagnosis of MDD. How is this treated? This condition is usually treated by mental health professionals, such as psychologists, psychiatrists, and clinical social workers. You may need more than one type of treatment. Treatment may include:  Psychotherapy. This is also called talk therapy or counseling. Types of psychotherapy include: ? Cognitive behavioral   therapy (CBT). This type of therapy teaches you to recognize unhealthy feelings, thoughts, and behaviors, and replace them with  positive thoughts and actions. ? Interpersonal therapy (IPT). This helps you to improve the way you relate to and communicate with others. ? Family therapy. This treatment includes members of your family.  Medicine to treat anxiety and depression, or to help you control certain emotions and behaviors.  Lifestyle changes, such as: ? Limiting alcohol and drug use. ? Exercising regularly. ? Getting plenty of sleep. ? Making healthy eating choices. ? Spending more time outdoors.  Treatments involving stimulation of the brain can be used in situations with extremely severe symptoms, or when medicine or other therapies do not work over time. These treatments include electroconvulsive therapy, transcranial magnetic stimulation, and vagal nerve stimulation. Follow these instructions at home: Activity  Return to your normal activities as told by your health care provider.  Exercise regularly and spend time outdoors as told by your health care provider. General instructions  Take over-the-counter and prescription medicines only as told by your health care provider.  Do not drink alcohol. If you drink alcohol, limit your alcohol intake to no more than 1 drink a day for nonpregnant women and 2 drinks a day for men. One drink equals 12 oz of beer, 5 oz of wine, or 1 oz of hard liquor. Alcohol can affect any antidepressant medicines you are taking. Talk to your health care provider about your alcohol use.  Eat a healthy diet and get plenty of sleep.  Find activities that you enjoy doing, and make time to do them.  Consider joining a support group. Your health care provider may be able to recommend a support group.  Keep all follow-up visits as told by your health care provider. This is important. Where to find more information: National Alliance on Mental Illness  www.nami.org  [place] National Institute of Mental Health  www.nimh.nih.gov  National Suicide Prevention  [employer]  1-800-273-TALK (8255). This is free, 24-hour help.  Contact a health care provider if:  Your symptoms get worse.  You develop new symptoms. Get help right away if:  You self-harm.  You have serious thoughts about hurting yourself or others.  You see, hear, taste, smell, or feel things that are not present (hallucinate). This information is not intended to replace advice given to you by your health care provider. Make sure you discuss any questions you have with your health care provider. Document Released: 10/21/2012 Document Revised: 03/02/2016 Document Reviewed: 01/05/2016 Elsevier Interactive Patient Education  2018 [employer]  

## 2018-03-07 DIAGNOSIS — F411 Generalized anxiety disorder: Secondary | ICD-10-CM | POA: Diagnosis not present

## 2018-03-07 DIAGNOSIS — F331 Major depressive disorder, recurrent, moderate: Secondary | ICD-10-CM | POA: Diagnosis not present

## 2018-04-20 ENCOUNTER — Encounter: Payer: Self-pay | Admitting: Internal Medicine

## 2018-04-22 ENCOUNTER — Other Ambulatory Visit: Payer: Self-pay | Admitting: Internal Medicine

## 2018-06-18 ENCOUNTER — Other Ambulatory Visit (INDEPENDENT_AMBULATORY_CARE_PROVIDER_SITE_OTHER): Payer: BLUE CROSS/BLUE SHIELD

## 2018-06-18 ENCOUNTER — Encounter: Payer: Self-pay | Admitting: Internal Medicine

## 2018-06-18 ENCOUNTER — Ambulatory Visit (INDEPENDENT_AMBULATORY_CARE_PROVIDER_SITE_OTHER): Payer: BLUE CROSS/BLUE SHIELD | Admitting: Internal Medicine

## 2018-06-18 VITALS — BP 110/78 | HR 80 | Temp 98.8°F | Resp 16 | Ht 76.0 in | Wt 214.0 lb

## 2018-06-18 DIAGNOSIS — R635 Abnormal weight gain: Secondary | ICD-10-CM

## 2018-06-18 DIAGNOSIS — Z0000 Encounter for general adult medical examination without abnormal findings: Secondary | ICD-10-CM

## 2018-06-18 DIAGNOSIS — R5383 Other fatigue: Secondary | ICD-10-CM

## 2018-06-18 DIAGNOSIS — R7989 Other specified abnormal findings of blood chemistry: Secondary | ICD-10-CM

## 2018-06-18 DIAGNOSIS — R945 Abnormal results of liver function studies: Secondary | ICD-10-CM | POA: Diagnosis not present

## 2018-06-18 LAB — COMPREHENSIVE METABOLIC PANEL
ALT: 66 U/L — ABNORMAL HIGH (ref 0–53)
AST: 36 U/L (ref 0–37)
Albumin: 4.7 g/dL (ref 3.5–5.2)
Alkaline Phosphatase: 66 U/L (ref 39–117)
BUN: 11 mg/dL (ref 6–23)
CO2: 30 mEq/L (ref 19–32)
Calcium: 9.6 mg/dL (ref 8.4–10.5)
Chloride: 101 mEq/L (ref 96–112)
Creatinine, Ser: 0.97 mg/dL (ref 0.40–1.50)
GFR: 94.5 mL/min (ref >60.00)
Glucose, Bld: 97 mg/dL (ref 70–99)
Potassium: 4.2 mEq/L (ref 3.5–5.1)
Sodium: 139 mEq/L (ref 135–145)
Total Bilirubin: 0.4 mg/dL (ref 0.2–1.2)
Total Protein: 7.4 g/dL (ref 6.0–8.3)

## 2018-06-18 LAB — CBC WITH DIFFERENTIAL/PLATELET
Basophils Absolute: 0.1 10*3/uL (ref 0.0–0.1)
Basophils Relative: 0.6 % (ref 0.0–3.0)
Eosinophils Absolute: 0.2 10*3/uL (ref 0.0–0.7)
Eosinophils Relative: 2.4 % (ref 0.0–5.0)
HCT: 42.9 % (ref 39.0–52.0)
Hemoglobin: 14.6 g/dL (ref 13.0–17.0)
Lymphocytes Relative: 35.1 % (ref 12.0–46.0)
Lymphs Abs: 2.9 10*3/uL (ref 0.7–4.0)
MCHC: 34.1 g/dL (ref 30.0–36.0)
MCV: 87.1 fl (ref 78.0–100.0)
Monocytes Absolute: 0.7 10*3/uL (ref 0.1–1.0)
Monocytes Relative: 8.9 % (ref 3.0–12.0)
Neutro Abs: 4.3 10*3/uL (ref 1.4–7.7)
Neutrophils Relative %: 53 % (ref 43.0–77.0)
Platelets: 258 10*3/uL (ref 150.0–400.0)
RBC: 4.92 Mil/uL (ref 4.22–5.81)
RDW: 11.8 % (ref 11.5–15.5)
WBC: 8.2 10*3/uL (ref 4.0–10.5)

## 2018-06-18 LAB — TSH: TSH: 1.39 u[IU]/mL (ref 0.35–4.50)

## 2018-06-18 NOTE — Progress Notes (Signed)
Subjective:  Patient ID: Zachary Becker, male    DOB: 1985-12-07  Age: 32 y.o. MRN: [id number]  CC: Annual Exam   HPI Zachary Becker presents for a CPX.  He complains of a several month history of fatigue and weight gain.  Since I last saw him he has seen a psychiatrist and has started taking mirtazapine.  This has helped with the insomnia.  He feels like the fatigue is a combination of lack of physical energy and lack of emotional motivation.  He does not crave naps and does not have excessive daytime sleepiness.  Outpatient Medications Prior to Visit  Medication Sig Dispense Refill  . CHANTIX CONTINUING MONTH PAK 1 MG tablet TAKE 1 TABLET BY MOUTH TWICE DAILY. 60 tablet 2  . gabapentin (NEURONTIN) 600 MG tablet     . mirtazapine (REMERON) 30 MG tablet Take 30 mg by mouth at bedtime.    . valACYclovir (VALTREX) 500 MG tablet TAKE 1 TABLET ONCE DAILY. 90 tablet 2  . lamoTRIgine (LAMICTAL) 25 MG tablet Take 1 tablet (25 mg total) by mouth 2 (two) times daily. 180 tablet 0   No facility-administered medications prior to visit.     ROS Review of Systems  Constitutional: Positive for fatigue and unexpected weight change. Negative for chills and diaphoresis.  HENT: Negative.  Negative for trouble swallowing.   Eyes: Negative for visual disturbance.  Respiratory: Negative for apnea, cough, chest tightness, shortness of breath and wheezing.   Cardiovascular: Negative for chest pain, palpitations and leg swelling.  Gastrointestinal: Negative.  Negative for abdominal pain, constipation, diarrhea, nausea and vomiting.  Endocrine: Negative.   Genitourinary: Negative.  Negative for difficulty urinating, dysuria, penile swelling, scrotal swelling, testicular pain and urgency.  Musculoskeletal: Negative.  Negative for arthralgias and myalgias.  Skin: Negative for color change.  Neurological: Negative.  Negative for dizziness, weakness and light-headedness.  Hematological:  Negative for adenopathy. Does not bruise/bleed easily.  Psychiatric/Behavioral: Positive for sleep disturbance. Negative for agitation, behavioral problems, confusion, decreased concentration, dysphoric mood, hallucinations, self-injury and suicidal ideas. The patient is nervous/anxious. The patient is not hyperactive.     Objective:  BP 110/78 (BP Location: Left Arm, Patient Position: Sitting, Cuff Size: Normal)   Pulse 80   Temp 98.8 F (37.1 C) (Oral)   Resp 16   Ht 6\' 4"  (1.93 m)   Wt 214 lb (97.1 kg)   SpO2 97%   BMI 26.05 kg/m   BP Readings from Last 3 Encounters:  06/18/18 110/78  02/13/18 128/80  11/06/17 120/70    Wt Readings from Last 3 Encounters:  06/18/18 214 lb (97.1 kg)  02/13/18 197 lb 8 oz (89.6 kg)  11/06/17 195 lb 8 oz (88.7 kg)    Physical Exam  Constitutional: He is oriented to person, place, and time. No distress.  HENT:  Mouth/Throat: Oropharynx is clear and moist. No oropharyngeal exudate.  Eyes: Conjunctivae are normal. No scleral icterus.  Neck: Normal range of motion. Neck supple. No JVD present. No thyromegaly present.  Cardiovascular: Normal rate, regular rhythm and normal heart sounds.  Pulmonary/Chest: Effort normal and breath sounds normal. No respiratory distress. He has no wheezes. He has no rhonchi. He has no rales.  Abdominal: Soft. Bowel sounds are normal. He exhibits no mass. There is no hepatosplenomegaly. There is no tenderness.  Musculoskeletal: Normal range of motion. He exhibits no edema, tenderness or deformity.  Lymphadenopathy:    He has no cervical adenopathy.  Neurological: He is  alert and oriented to person, place, and time.  Skin: He is not diaphoretic.  Vitals reviewed.   Lab Results  Component Value Date   WBC 8.2 06/18/2018   HGB 14.6 06/18/2018   HCT 42.9 06/18/2018   PLT 258.0 06/18/2018   GLUCOSE 97 06/18/2018   CHOL 129 07/24/2016   TRIG 118.0 07/24/2016   HDL 35.60 (L) 07/24/2016   LDLCALC 69 07/24/2016     ALT 66 (H) 06/18/2018   AST 36 06/18/2018   NA 139 06/18/2018   K 4.2 06/18/2018   CL 101 06/18/2018   CREATININE 0.97 06/18/2018   BUN 11 06/18/2018   CO2 30 06/18/2018   TSH 1.39 06/18/2018    US Scrotum  Result Date: 07/18/2011 *RADIOLOGY REPORT* Clinical Data:  Left testicular pain and small palpable lump for several months SCROTAL ULTRASOUND DOPPLER ULTRASOUND OF THE TESTICLES Technique: Complete ultrasound examination of the testicles, epididymis, and other scrotal structures was performed.  Color and spectral Doppler ultrasound were also utilized to evaluate blood flow to the testicles. Comparison:  None Findings: Right testis:  The right testicle measures 4.3 x 2.3 x 3.5 cm. Blood flow is demonstrated with arterial and venous waveforms. Left testis:  The left testicle measures 4.5 x 2.7 x 3.2 cm.  Blood flow is demonstrated with arterial and venous waveforms. Right epididymis:  The right epididymis appears normal. Left epididymis:  The left epididymis is normal. Hydocele:  It a small amount of fluid is noted bilaterally. Varicocele:  No hydrocele is seen. The area palpated appears to represent a small extratesticular cystic structure inferior to the left testicle measuring 5 x 3 x 3 mm of doubtful significance. Pulsed Doppler interrogation of both testes demonstrates low resistance flow bilaterally. IMPRESSION: 1.  No intratesticular abnormality.  Blood flow is demonstrated to both testicles. 2.  Small cystic structure extratesticular and  inferior to the left testicle appears to represent the palpable abnormality and is of doubtful clinical significance. 3.  Small amount of fluid bilaterally. Original Report Authenticated By: Joretta Bachelor, M.D.  [place] Art/ven Flow Abd Pelv Doppler  Result Date: 07/18/2011 *RADIOLOGY REPORT* Clinical Data:  Left testicular pain and small palpable lump for several months SCROTAL ULTRASOUND DOPPLER ULTRASOUND OF THE TESTICLES Technique: Complete ultrasound  examination of the testicles, epididymis, and other scrotal structures was performed.  Color and spectral Doppler ultrasound were also utilized to evaluate blood flow to the testicles. Comparison:  None Findings: Right testis:  The right testicle measures 4.3 x 2.3 x 3.5 cm. Blood flow is demonstrated with arterial and venous waveforms. Left testis:  The left testicle measures 4.5 x 2.7 x 3.2 cm.  Blood flow is demonstrated with arterial and venous waveforms. Right epididymis:  The right epididymis appears normal. Left epididymis:  The left epididymis is normal. Hydocele:  It a small amount of fluid is noted bilaterally. Varicocele:  No hydrocele is seen. The area palpated appears to represent a small extratesticular cystic structure inferior to the left testicle measuring 5 x 3 x 3 mm of doubtful significance. Pulsed Doppler interrogation of both testes demonstrates low resistance flow bilaterally. IMPRESSION: 1.  No intratesticular abnormality.  Blood flow is demonstrated to both testicles. 2.  Small cystic structure extratesticular and  inferior to the left testicle appears to represent the palpable abnormality and is of doubtful clinical significance. 3.  Small amount of fluid bilaterally. Original Report Authenticated By: Joretta Bachelor, M.D.   Assessment & Plan:   Romari was seen  today for annual exam.  Diagnoses and all orders for this visit:  Fatigue, unspecified type - The only remarkable finding on his labs is mildly elevated AST and ALT.  His fatigue is likely caused by mirtazapine. -     CBC with Differential/Platelet; Future -     Comprehensive metabolic panel; Future -     TSH; Future -     Testosterone Total,Free,Bio, Males; Future -     Epstein-Barr virus VCA antibody panel; Future  Weight gain, abnormal- His labs are negative for any secondary causes.  This is likely caused by the mirtazapine. -     CBC with Differential/Platelet; Future -     Comprehensive metabolic panel; Future -      TSH; Future -     Testosterone Total,Free,Bio, Males; Future  Routine general medical examination at a health care facility-exam completed, labs reviewed, vaccines reviewed and updated, patient education material was given. -     HIV Antibody (routine testing w rflx); Future  Elevated LFTs- He has developed mildly elevated AST and ALT.  He has gained weight so this could be fatty liver disease.  I will screen him for drug-induced hepatitis, viral hepatitis, Wilson's disease, and Epstein-Barr infection.  Will recheck his LFTs and if they remain elevated then will consider doing an ultrasound of his liver. -     Hepatitis B surface antigen; Future -     Hepatitis B surface antibody,quantitative; Future -     Hepatitis A antibody, total; Future -     Hepatitis C antibody; Future -     Hepatic function panel; Future -     AntiMicrosomal Ab-Liver / Kidney; Future -     Ceruloplasmin; Future -     Epstein-Barr virus VCA antibody panel; Future   I have discontinued Kaydon T. Dehoyos Becker "Tal"'s lamoTRIgine. I am also having him maintain his valACYclovir, gabapentin, CHANTIX CONTINUING MONTH PAK, and mirtazapine.  No orders of the defined types were placed in this encounter.    Follow-up: No follow-ups on file.  Scarlette Calico, MD

## 2018-06-19 ENCOUNTER — Encounter: Payer: Self-pay | Admitting: Internal Medicine

## 2018-06-19 ENCOUNTER — Other Ambulatory Visit (INDEPENDENT_AMBULATORY_CARE_PROVIDER_SITE_OTHER): Payer: BLUE CROSS/BLUE SHIELD

## 2018-06-19 DIAGNOSIS — R5383 Other fatigue: Secondary | ICD-10-CM

## 2018-06-19 DIAGNOSIS — R945 Abnormal results of liver function studies: Secondary | ICD-10-CM | POA: Diagnosis not present

## 2018-06-19 DIAGNOSIS — R7989 Other specified abnormal findings of blood chemistry: Secondary | ICD-10-CM | POA: Insufficient documentation

## 2018-06-19 LAB — HIV ANTIBODY (ROUTINE TESTING W REFLEX): HIV 1&2 Ab, 4th Generation: NONREACTIVE

## 2018-06-19 LAB — TESTOSTERONE TOTAL,FREE,BIO, MALES
Albumin: 4.5 g/dL (ref 3.6–5.1)
Sex Hormone Binding: 24 nmol/L (ref 10–50)
Testosterone, Bioavailable: 128.9 ng/dL (ref >=110.0)
Testosterone, Free: 62.7 pg/mL (ref 46.0–224.0)
Testosterone: 376 ng/dL (ref 250–827)

## 2018-06-19 LAB — HEPATIC FUNCTION PANEL
ALT: 62 U/L — ABNORMAL HIGH (ref 0–53)
AST: 44 U/L — ABNORMAL HIGH (ref 0–37)
Albumin: 4.6 g/dL (ref 3.5–5.2)
Alkaline Phosphatase: 65 U/L (ref 39–117)
Bilirubin, Direct: 0.1 mg/dL (ref 0.0–0.3)
Total Bilirubin: 0.4 mg/dL (ref 0.2–1.2)
Total Protein: 7.1 g/dL (ref 6.0–8.3)

## 2018-06-20 ENCOUNTER — Encounter: Payer: Self-pay | Admitting: Internal Medicine

## 2018-06-20 NOTE — Addendum Note (Signed)
Addended by: Janith Lima on: 06/20/2018 08:12 AM   Modules accepted: Orders

## 2018-06-21 ENCOUNTER — Ambulatory Visit: Payer: Self-pay | Admitting: *Deleted

## 2018-06-21 NOTE — Telephone Encounter (Signed)
Message from Berneta Levins sent at 06/21/2018 1:32 PM EST   Summary: [place] and lab question   Pt calling with question for nurse: states that after ordering his [place] there was an EBB lab that came back that may have accounted for the elevated liver enzymes. Pt wants to know if he should still have the ultrasound done.         Also see MyChart message from 06/20/18.

## 2018-06-21 NOTE — Telephone Encounter (Signed)
  Reason for Disposition . [1] Follow-up call from patient regarding patient's clinical status AND [2] information urgent  Answer Assessment - Initial Assessment Questions 1. REASON FOR CALL or QUESTION: "What is your reason for calling today?" or "How can I best help you?" or "What question do you have that I can help answer?"     Pt asking if he should still have ultrasound 2. CALLER: Document the source of call. (e.g., laboratory, patient).     patient  Protocols used: PCP CALL - NO TRIAGE-A-AH

## 2018-06-21 NOTE — Telephone Encounter (Signed)
Pt sent my chart message regarding same.

## 2018-06-23 LAB — HEPATITIS C ANTIBODY
Hepatitis C Ab: NONREACTIVE
SIGNAL TO CUT-OFF: 0.02 (ref <1.00)

## 2018-06-23 LAB — HEPATITIS A ANTIBODY, TOTAL: Hepatitis A AB,Total: NONREACTIVE

## 2018-06-23 LAB — HEPATITIS B SURFACE ANTIGEN: Hepatitis B Surface Ag: NONREACTIVE

## 2018-06-23 LAB — EPSTEIN-BARR VIRUS VCA ANTIBODY PANEL
EBV NA IgG: 435 U/mL — ABNORMAL HIGH
EBV VCA IgG: 192 U/mL — ABNORMAL HIGH
EBV VCA IgM: >160 U/mL — ABNORMAL HIGH

## 2018-06-23 LAB — CERULOPLASMIN: Ceruloplasmin: 30 mg/dL (ref 18–36)

## 2018-06-23 LAB — ANTI-MICROSOMAL ANTIBODY LIVER / KIDNEY: LKM1 Ab: <=20 U (ref <=20.0)

## 2018-06-23 LAB — HEPATITIS B SURFACE ANTIBODY, QUANTITATIVE: Hep B S AB Quant (Post): <5 m[IU]/mL — ABNORMAL LOW (ref >=10)

## 2018-06-27 ENCOUNTER — Encounter: Payer: Self-pay | Admitting: Internal Medicine

## 2018-06-27 ENCOUNTER — Ambulatory Visit
Admission: RE | Admit: 2018-06-27 | Discharge: 2018-06-27 | Disposition: A | Discharge status: Home / Self Care | Payer: BLUE CROSS/BLUE SHIELD | Source: Ambulatory Visit | Attending: Internal Medicine | Admitting: Internal Medicine

## 2018-06-27 DIAGNOSIS — R7989 Other specified abnormal findings of blood chemistry: Secondary | ICD-10-CM

## 2018-06-27 DIAGNOSIS — R945 Abnormal results of liver function studies: Principal | ICD-10-CM

## 2018-07-07 ENCOUNTER — Other Ambulatory Visit: Payer: Self-pay | Admitting: Internal Medicine

## 2018-07-18 ENCOUNTER — Encounter: Payer: Self-pay | Admitting: Internal Medicine

## 2018-08-26 ENCOUNTER — Encounter: Payer: Self-pay | Admitting: Internal Medicine

## 2018-08-26 ENCOUNTER — Other Ambulatory Visit: Payer: Self-pay | Admitting: Internal Medicine

## 2018-08-26 DIAGNOSIS — R7989 Other specified abnormal findings of blood chemistry: Secondary | ICD-10-CM

## 2018-08-26 DIAGNOSIS — R945 Abnormal results of liver function studies: Principal | ICD-10-CM

## 2018-08-27 ENCOUNTER — Other Ambulatory Visit (INDEPENDENT_AMBULATORY_CARE_PROVIDER_SITE_OTHER): Payer: BLUE CROSS/BLUE SHIELD

## 2018-08-27 DIAGNOSIS — R7989 Other specified abnormal findings of blood chemistry: Secondary | ICD-10-CM

## 2018-08-27 DIAGNOSIS — R945 Abnormal results of liver function studies: Secondary | ICD-10-CM | POA: Diagnosis not present

## 2018-08-27 LAB — HEPATIC FUNCTION PANEL
ALT: 32 U/L (ref 0–53)
AST: 21 U/L (ref 0–37)
Albumin: 4.5 g/dL (ref 3.5–5.2)
Alkaline Phosphatase: 61 U/L (ref 39–117)
Bilirubin, Direct: 0.1 mg/dL (ref 0.0–0.3)
Total Bilirubin: 0.4 mg/dL (ref 0.2–1.2)
Total Protein: 7.2 g/dL (ref 6.0–8.3)

## 2018-08-28 ENCOUNTER — Encounter: Payer: Self-pay | Admitting: Internal Medicine

## 2018-08-28 NOTE — Telephone Encounter (Signed)
Please advise 

## 2018-09-13 DIAGNOSIS — F3342 Major depressive disorder, recurrent, in full remission: Secondary | ICD-10-CM | POA: Diagnosis not present

## 2018-09-13 DIAGNOSIS — F411 Generalized anxiety disorder: Secondary | ICD-10-CM | POA: Diagnosis not present

## 2018-10-07 ENCOUNTER — Ambulatory Visit (INDEPENDENT_AMBULATORY_CARE_PROVIDER_SITE_OTHER): Payer: BLUE CROSS/BLUE SHIELD | Admitting: Internal Medicine

## 2018-10-07 ENCOUNTER — Encounter: Payer: Self-pay | Admitting: Internal Medicine

## 2018-10-07 DIAGNOSIS — F411 Generalized anxiety disorder: Secondary | ICD-10-CM

## 2018-10-07 DIAGNOSIS — F458 Other somatoform disorders: Secondary | ICD-10-CM

## 2018-10-07 DIAGNOSIS — F419 Anxiety disorder, unspecified: Secondary | ICD-10-CM | POA: Insufficient documentation

## 2018-10-07 MED ORDER — PROPRANOLOL HCL 10 MG PO TABS: 10.0000 mg | ORAL_TABLET | Freq: Two times a day (BID) | ORAL | 3 refills | Status: DC

## 2018-10-07 NOTE — Progress Notes (Signed)
Virtual Visit via Video Note  I connected with Zachary Becker on 10/07/18 at  3:30 PM EDT by a video enabled telemedicine application and verified that I am speaking with the correct person using two identifiers.   I discussed the limitations of evaluation and management by telemedicine and the availability of in person appointments. The patient expressed understanding and agreed to proceed.  History of Present Illness: Patient scheduled a virtual visit.  He is under a lot of stress at work.  With the COVID-19 pandemic he is worried about job security.  His partner is a Doctor, general practice and is not currently working.  He tells me that this has increased his anxiety and panicky feeling.  He has had some episodes of heart rate elevated into the 80s and clammy hands.  His symptoms are worsened when he has to do projects at work and when he works at home during the evenings.  He does report some component of performance anxiety. 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  He is seeing a psychiatrist and is taking Wellbutrin, Remeron, and gabapentin.  He wants to continue taking those.    Observations/Objective:- He was calm, alert, focused, and oriented.  He was slightly anxious/suspicious but his affect was otherwise normal and his mood was normal.   Assessment and Plan: He is describing generalized anxiety disorder with some component of performance anxiety.   Follow Up Instructions:- He will try a course of propranolol.  He has a history of hypotension so will start at a low dose and will see how well he tolerates it.  The dose can be increased if needed.    I discussed the assessment and treatment plan with the patient. The patient was provided an opportunity to ask questions and all were answered. The patient agreed with the plan and demonstrated an understanding of the instructions.   The patient was advised to call back or  seek an in-person evaluation if the symptoms worsen or if the condition fails to improve as anticipated.  I provided 20 minutes of non-face-to-face time during this encounter.   Scarlette Calico, MD

## 2018-10-22 ENCOUNTER — Encounter: Payer: Self-pay | Admitting: Internal Medicine

## 2018-11-14 ENCOUNTER — Encounter: Payer: Self-pay | Admitting: Internal Medicine

## 2018-12-09 ENCOUNTER — Other Ambulatory Visit: Payer: Self-pay | Admitting: Internal Medicine

## 2018-12-09 DIAGNOSIS — B001 Herpesviral vesicular dermatitis: Secondary | ICD-10-CM

## 2018-12-19 DIAGNOSIS — Z03818 Encounter for observation for suspected exposure to other biological agents ruled out: Secondary | ICD-10-CM | POA: Diagnosis not present

## 2018-12-19 DIAGNOSIS — D229 Melanocytic nevi, unspecified: Secondary | ICD-10-CM | POA: Diagnosis not present

## 2018-12-19 DIAGNOSIS — L821 Other seborrheic keratosis: Secondary | ICD-10-CM | POA: Diagnosis not present

## 2018-12-19 DIAGNOSIS — L814 Other melanin hyperpigmentation: Secondary | ICD-10-CM | POA: Diagnosis not present

## 2018-12-19 DIAGNOSIS — L218 Other seborrheic dermatitis: Secondary | ICD-10-CM | POA: Diagnosis not present

## 2019-02-09 ENCOUNTER — Other Ambulatory Visit: Payer: Self-pay | Admitting: Internal Medicine

## 2019-02-09 DIAGNOSIS — F411 Generalized anxiety disorder: Secondary | ICD-10-CM

## 2019-02-28 ENCOUNTER — Other Ambulatory Visit: Payer: Self-pay | Admitting: Internal Medicine

## 2019-02-28 ENCOUNTER — Encounter: Payer: Self-pay | Admitting: Internal Medicine

## 2019-03-03 ENCOUNTER — Other Ambulatory Visit: Payer: Self-pay | Admitting: Internal Medicine

## 2019-03-03 DIAGNOSIS — R7989 Other specified abnormal findings of blood chemistry: Secondary | ICD-10-CM

## 2019-03-09 ENCOUNTER — Other Ambulatory Visit: Payer: Self-pay | Admitting: Internal Medicine

## 2019-03-09 DIAGNOSIS — B001 Herpesviral vesicular dermatitis: Secondary | ICD-10-CM

## 2019-03-28 ENCOUNTER — Other Ambulatory Visit: Payer: Self-pay

## 2019-03-28 DIAGNOSIS — R6889 Other general symptoms and signs: Secondary | ICD-10-CM | POA: Diagnosis not present

## 2019-03-28 DIAGNOSIS — Z20822 Contact with and (suspected) exposure to covid-19: Secondary | ICD-10-CM

## 2019-03-29 LAB — NOVEL CORONAVIRUS, NAA: SARS-CoV-2, NAA: NOT DETECTED

## 2019-04-14 DIAGNOSIS — F4321 Adjustment disorder with depressed mood: Secondary | ICD-10-CM | POA: Diagnosis not present

## 2019-04-18 ENCOUNTER — Other Ambulatory Visit: Payer: Self-pay

## 2019-04-18 DIAGNOSIS — Z20822 Contact with and (suspected) exposure to covid-19: Secondary | ICD-10-CM

## 2019-04-18 DIAGNOSIS — Z20828 Contact with and (suspected) exposure to other viral communicable diseases: Secondary | ICD-10-CM | POA: Diagnosis not present

## 2019-04-19 LAB — NOVEL CORONAVIRUS, NAA: SARS-CoV-2, NAA: NOT DETECTED

## 2019-04-24 ENCOUNTER — Other Ambulatory Visit: Payer: Self-pay

## 2019-04-24 DIAGNOSIS — Z20822 Contact with and (suspected) exposure to covid-19: Secondary | ICD-10-CM

## 2019-04-24 DIAGNOSIS — Z20828 Contact with and (suspected) exposure to other viral communicable diseases: Secondary | ICD-10-CM | POA: Diagnosis not present

## 2019-04-25 LAB — NOVEL CORONAVIRUS, NAA: SARS-CoV-2, NAA: NOT DETECTED

## 2019-04-28 DIAGNOSIS — F3342 Major depressive disorder, recurrent, in full remission: Secondary | ICD-10-CM | POA: Diagnosis not present

## 2019-04-28 DIAGNOSIS — F411 Generalized anxiety disorder: Secondary | ICD-10-CM | POA: Diagnosis not present

## 2019-05-01 ENCOUNTER — Other Ambulatory Visit: Payer: Self-pay

## 2019-05-01 DIAGNOSIS — Z20822 Contact with and (suspected) exposure to covid-19: Secondary | ICD-10-CM

## 2019-05-01 DIAGNOSIS — Z20828 Contact with and (suspected) exposure to other viral communicable diseases: Secondary | ICD-10-CM | POA: Diagnosis not present

## 2019-05-03 LAB — NOVEL CORONAVIRUS, NAA: SARS-CoV-2, NAA: NOT DETECTED

## 2019-05-14 DIAGNOSIS — R5383 Other fatigue: Secondary | ICD-10-CM | POA: Diagnosis not present

## 2019-05-14 DIAGNOSIS — Z13220 Encounter for screening for lipoid disorders: Secondary | ICD-10-CM | POA: Diagnosis not present

## 2019-05-14 DIAGNOSIS — R519 Headache, unspecified: Secondary | ICD-10-CM | POA: Diagnosis not present

## 2019-05-14 DIAGNOSIS — Z23 Encounter for immunization: Secondary | ICD-10-CM | POA: Diagnosis not present

## 2019-05-14 LAB — BASIC METABOLIC PANEL
BUN: 16 (ref 4–21)
CO2: 30 — AB (ref 13–22)
Chloride: 102 (ref 99–108)
Creatinine: 1.2 (ref 0.6–1.3)
Glucose: 99
Potassium: 4.8 (ref 3.4–5.3)
Sodium: 140 (ref 137–147)

## 2019-05-14 LAB — HEPATIC FUNCTION PANEL
ALT: 15 (ref 10–40)
AST: 13 — AB (ref 14–40)
Alkaline Phosphatase: 59 (ref 25–125)
Bilirubin, Total: 0.6

## 2019-05-14 LAB — LIPID PANEL
Cholesterol: 172 (ref 0–200)
HDL: 42 (ref 35–70)
LDL Cholesterol: 129
LDl/HDL Ratio: 4.1
Triglycerides: 67 (ref 40–160)

## 2019-05-14 LAB — CBC AND DIFFERENTIAL
HCT: 40 — AB (ref 41–53)
Hemoglobin: 14 (ref 13.5–17.5)
Platelets: 221 (ref 150–399)
WBC: 5.9

## 2019-05-14 LAB — IRON,TIBC AND FERRITIN PANEL
%SAT: 27
Ferritin: 201
Iron: 77
TIBC: 281

## 2019-05-14 LAB — TSH: TSH: 1.39 (ref 0.41–5.90)

## 2019-05-14 LAB — CBC: RBC: 4.53 (ref 3.87–5.11)

## 2019-05-14 LAB — VITAMIN B12: Vitamin B-12: 550

## 2019-05-14 LAB — COMPREHENSIVE METABOLIC PANEL
Albumin: 4.7 (ref 3.5–5.0)
Calcium: 9.5 (ref 8.7–10.7)

## 2019-05-14 LAB — VITAMIN D 25 HYDROXY (VIT D DEFICIENCY, FRACTURES): Vit D, 25-Hydroxy: 49.8

## 2019-05-16 ENCOUNTER — Other Ambulatory Visit: Payer: Self-pay

## 2019-05-16 DIAGNOSIS — Z20822 Contact with and (suspected) exposure to covid-19: Secondary | ICD-10-CM

## 2019-05-17 LAB — NOVEL CORONAVIRUS, NAA: SARS-CoV-2, NAA: NOT DETECTED

## 2019-06-01 DIAGNOSIS — Z20828 Contact with and (suspected) exposure to other viral communicable diseases: Secondary | ICD-10-CM | POA: Diagnosis not present

## 2019-06-07 ENCOUNTER — Other Ambulatory Visit: Payer: Self-pay | Admitting: Internal Medicine

## 2019-06-07 DIAGNOSIS — B001 Herpesviral vesicular dermatitis: Secondary | ICD-10-CM

## 2019-06-13 DIAGNOSIS — Z23 Encounter for immunization: Secondary | ICD-10-CM | POA: Diagnosis not present

## 2019-06-16 DIAGNOSIS — Z20828 Contact with and (suspected) exposure to other viral communicable diseases: Secondary | ICD-10-CM | POA: Diagnosis not present

## 2019-06-29 DIAGNOSIS — Z20828 Contact with and (suspected) exposure to other viral communicable diseases: Secondary | ICD-10-CM | POA: Diagnosis not present

## 2019-07-15 ENCOUNTER — Ambulatory Visit: Payer: BC Managed Care – PPO | Attending: Internal Medicine

## 2019-07-15 DIAGNOSIS — Z20822 Contact with and (suspected) exposure to covid-19: Secondary | ICD-10-CM

## 2019-07-16 LAB — NOVEL CORONAVIRUS, NAA: SARS-CoV-2, NAA: NOT DETECTED

## 2019-07-17 DIAGNOSIS — F4321 Adjustment disorder with depressed mood: Secondary | ICD-10-CM | POA: Diagnosis not present

## 2019-07-29 DIAGNOSIS — Z20828 Contact with and (suspected) exposure to other viral communicable diseases: Secondary | ICD-10-CM | POA: Diagnosis not present

## 2019-08-07 DIAGNOSIS — F4321 Adjustment disorder with depressed mood: Secondary | ICD-10-CM | POA: Diagnosis not present

## 2019-08-16 DIAGNOSIS — Z20828 Contact with and (suspected) exposure to other viral communicable diseases: Secondary | ICD-10-CM | POA: Diagnosis not present

## 2019-09-11 ENCOUNTER — Other Ambulatory Visit: Payer: Self-pay | Admitting: Internal Medicine

## 2019-09-22 ENCOUNTER — Other Ambulatory Visit: Payer: Self-pay

## 2019-09-22 ENCOUNTER — Encounter: Payer: Self-pay | Admitting: Family

## 2019-09-22 ENCOUNTER — Ambulatory Visit: Payer: BC Managed Care – PPO | Admitting: Family

## 2019-09-22 VITALS — BP 118/72 | HR 73 | Temp 98.0°F | Ht 76.0 in | Wt 181.0 lb

## 2019-09-22 DIAGNOSIS — Z113 Encounter for screening for infections with a predominantly sexual mode of transmission: Secondary | ICD-10-CM | POA: Diagnosis not present

## 2019-09-22 MED ORDER — VALACYCLOVIR HCL 1 G PO TABS: 1000.0000 mg | ORAL_TABLET | Freq: Three times a day (TID) | ORAL | 1 refills | Status: DC

## 2019-09-22 MED ORDER — VALACYCLOVIR HCL 500 MG PO TABS: 500.0000 mg | ORAL_TABLET | Freq: Every day | ORAL | Status: DC

## 2019-09-22 NOTE — Progress Notes (Signed)
Zachary Becker is a 34 y.o. male with the following history as recorded in Epic[employer]:  Patient Active Problem List   Diagnosis Date Noted  . Benign nevus without atypia 11/07/2017  . Generalized anxiety disorder 08/22/2017  . Tobacco abuse disorder 10/31/2016  . Herpes labialis without complication [id number]  . Routine general medical examination at a health care facility 08/22/2016    Current Outpatient Medications  Medication Sig Dispense Refill  . ALPRAZolam (XANAX) 0.5 MG tablet Take 0.5 mg by mouth 2 (two) times daily as needed.    Marland Kitchen amoxicillin (AMOXIL) 500 MG capsule Take 1,000 mg by mouth 2 (two) times daily.    Marland Kitchen buPROPion (WELLBUTRIN XL) 300 MG 24 hr tablet Take 300 mg by mouth daily.    . DAYVIGO 10 MG TABS     . gabapentin (NEURONTIN) 600 MG tablet     . propranolol (INDERAL) 10 MG tablet TAKE ONE TABLET BY MOUTH TWICE A DAY 60 tablet 2  . Tadalafil 2.5 MG TABS Take 1 tablet by mouth daily.    . valACYclovir (VALTREX) 1000 MG tablet Take 1 tablet (1,000 mg total) by mouth 3 (three) times daily. 15 tablet 1  . valACYclovir (VALTREX) 500 MG tablet Take 1 tablet (500 mg total) by mouth daily.     No current facility-administered medications for this visit.    Allergies: Patient has no known allergies.  Past Medical History:  Diagnosis Date  . Anxiety   . Depression     History reviewed. No pertinent surgical history.  Family History  Problem Relation Age of Onset  . Cancer Neg Hx   . Stroke Neg Hx   . Hypertension Neg Hx   . Heart disease Neg Hx   . Depression Neg Hx   . Diabetes Neg Hx   . Early death Neg Hx     Social History   Tobacco Use  . Smoking status: Former Smoker    Quit date: 02/21/2018    Years since quitting: 1.5  . Smokeless tobacco: Never Used  Substance Use Topics  . Alcohol use: No    Alcohol/week: 0.0 standard drinks    Subjective:  Requesting STD screen today; no specific symptoms today; in new relationship and wanted to be  safe;  Does take daily Valtrex 500 mg to help with preventive cold sores; uses 1000 mg as needed for outbreak;   Objective:  Vitals:   09/22/19 1527  BP: 118/72  Pulse: 73  Temp: 98 F (36.7 C)  TempSrc: Oral  SpO2: 97%  Weight: 181 lb (82.1 kg)  Height: 6\' 4"  (1.93 m)    General: Well developed, well nourished, in no acute distress  Skin : Warm and dry.  Head: Normocephalic and atraumatic  Lungs: Respirations unlabored; clear to auscultation bilaterally without wheeze, rales, rhonchi  Neurologic: Alert and oriented; speech intact; face symmetrical; moves all extremities well; CNII-XII intact without focal deficit   Assessment:  1. Screening examination for STD (sexually transmitted disease)     Plan:  Labs updated today per patient request; follow-up to be determined;  This visit occurred during the SARS-CoV-2 public health emergency.  Safety protocols were in place, including screening questions prior to the visit, additional usage of staff PPE, and extensive cleaning of exam room while observing appropriate contact time as indicated for disinfecting solutions.     No follow-ups on file.  Orders Placed This Encounter  Procedures  . GC/Chlamydia Probe Amp([facility])  . HIV Antibody (routine testing  w rflx)  . RPR    Requested Prescriptions   Signed Prescriptions Disp Refills  . valACYclovir (VALTREX) 500 MG tablet      Sig: Take 1 tablet (500 mg total) by mouth daily.  . valACYclovir (VALTREX) 1000 MG tablet 15 tablet 1    Sig: Take 1 tablet (1,000 mg total) by mouth 3 (three) times daily.

## 2019-09-23 ENCOUNTER — Encounter: Payer: Self-pay | Admitting: Family

## 2019-09-23 LAB — GC/CHLAMYDIA PROBE AMP

## 2019-09-23 LAB — HIV ANTIBODY (ROUTINE TESTING W REFLEX): HIV 1&2 Ab, 4th Generation: NONREACTIVE

## 2019-09-23 LAB — RPR: RPR Ser Ql: NONREACTIVE

## 2019-09-24 ENCOUNTER — Other Ambulatory Visit: Payer: Self-pay | Admitting: Family

## 2019-09-24 DIAGNOSIS — Z113 Encounter for screening for infections with a predominantly sexual mode of transmission: Secondary | ICD-10-CM

## 2019-09-26 ENCOUNTER — Other Ambulatory Visit: Payer: BC Managed Care – PPO

## 2019-09-26 DIAGNOSIS — Z113 Encounter for screening for infections with a predominantly sexual mode of transmission: Secondary | ICD-10-CM

## 2019-09-27 ENCOUNTER — Encounter: Payer: Self-pay | Admitting: Internal Medicine

## 2019-09-27 ENCOUNTER — Ambulatory Visit (INDEPENDENT_AMBULATORY_CARE_PROVIDER_SITE_OTHER)
Admission: RE | Admit: 2019-09-27 | Discharge: 2019-09-27 | Disposition: A | Discharge status: Home / Self Care | Payer: BC Managed Care – PPO | Source: Ambulatory Visit

## 2019-09-27 DIAGNOSIS — Z202 Contact with and (suspected) exposure to infections with a predominantly sexual mode of transmission: Secondary | ICD-10-CM | POA: Diagnosis not present

## 2019-09-27 MED ORDER — DOXYCYCLINE HYCLATE 100 MG PO CAPS: 100.0000 mg | ORAL_CAPSULE | Freq: Two times a day (BID) | ORAL | 0 refills | Status: DC

## 2019-09-27 NOTE — Discharge Instructions (Signed)
You were treated empirically for chlamydia. Start doxycycline as directed. Your PCP will contact you if needing further treatment. Refrain from sexual activity and alcohol use for the next 7 days.  If developing testicular swelling/pain, penile lesion/sore, follow up for reevaluation.   [facility] at [facility]) [place], [place], [place] [address] [phone]  [facility] at [facility] [address] [place], [place] [address] [phone]  [facility] at [facility] [address] [place], [place] [address] [phone]  [facility] at [place], [place], [place], [place] [address] [phone]  [facility] at [place] [address] [place], [place] [address] [phone]  [facility] at [place], [place],  [address] [phone]

## 2019-09-27 NOTE — ED Provider Notes (Signed)
Virtual Visit via Video Note:  Zachary Becker  initiated request for Telemedicine visit with [facility] team. I connected with Zachary Becker  on 09/27/2019 at 3:28 PM  for a synchronized telemedicine visit using a video enabled HIPPA compliant telemedicine application. I verified that I am speaking with Zachary Becker  using two identifiers. Deanne Bedgood Jodell Cipro, PA-C  was physically located in a [facility] Urgent care site and [place] Becker was located at a different location.   The limitations of evaluation and management by telemedicine as well as the availability of in-person appointments were discussed. Patient was informed that he  may incur a bill ( including co-pay) for this virtual visit encounter. Zachary Becker  expressed understanding and gave verbal consent to proceed with virtual visit.     History of Present Illness:Zachary Becker  is a 34 y.o. male presents with STD exposure requesting treatment. He is asymptomatic. Denies fever, chills, body aches. Denies abdominal pain, nausea, vomiting. Denies urinary symptoms such as frequency, dysuria, hematuria. Denies penile discharge, penile sore/ulcer, testicular swelling, testicular pain. Sexually active with one male partner, no condom use.   Past Medical History:  Diagnosis Date  . Anxiety   . Depression     No Known Allergies      Observations/Objective: Patient contacted via phone. Alert and oriented x 4. Speaking in full sentences without distress.    Assessment and Plan: Patient already with cytology pending. Discussed waiting for results vs empiric treatment. Given exposure, patient would like to proceed with empiric treatment. Given recent changes to [facility] treatment guidelines, will have patient start doxycycline as directed to cover for chlamydia. Will have patient monitor cytology results. Return precautions given. Patient expresses understanding and agrees to  plan.  Follow Up Instructions:    I discussed the assessment and treatment plan with the patient. The patient was provided an opportunity to ask questions and all were answered. The patient agreed with the plan and demonstrated an understanding of the instructions.   The patient was advised to call back or seek an in-person evaluation if the symptoms worsen or if the condition fails to improve as anticipated.  I provided 15 minutes of non-face-to-face time during this encounter.    Ok Edwards, PA-C  09/27/2019 3:28 PM         Ok Edwards, PA-C 09/27/19 1546

## 2019-09-29 ENCOUNTER — Encounter: Payer: Self-pay | Admitting: Family

## 2019-10-01 ENCOUNTER — Encounter: Payer: Self-pay | Admitting: Internal Medicine

## 2019-10-01 NOTE — Telephone Encounter (Signed)
  Patient calling to lab results. Please call

## 2019-10-02 LAB — GC/CHLAMYDIA PROBE AMP
Chlamydia trachomatis, NAA: NEGATIVE
Neisseria Gonorrhoeae by PCR: NEGATIVE

## 2019-10-20 DIAGNOSIS — F411 Generalized anxiety disorder: Secondary | ICD-10-CM | POA: Diagnosis not present

## 2019-10-20 DIAGNOSIS — F3342 Major depressive disorder, recurrent, in full remission: Secondary | ICD-10-CM | POA: Diagnosis not present

## 2019-10-23 DIAGNOSIS — Z23 Encounter for immunization: Secondary | ICD-10-CM | POA: Diagnosis not present

## 2019-11-27 DIAGNOSIS — Z20828 Contact with and (suspected) exposure to other viral communicable diseases: Secondary | ICD-10-CM | POA: Diagnosis not present

## 2019-11-27 DIAGNOSIS — Z03818 Encounter for observation for suspected exposure to other biological agents ruled out: Secondary | ICD-10-CM | POA: Diagnosis not present

## 2019-12-22 DIAGNOSIS — L905 Scar conditions and fibrosis of skin: Secondary | ICD-10-CM | POA: Diagnosis not present

## 2019-12-22 DIAGNOSIS — L814 Other melanin hyperpigmentation: Secondary | ICD-10-CM | POA: Diagnosis not present

## 2019-12-22 DIAGNOSIS — Z872 Personal history of diseases of the skin and subcutaneous tissue: Secondary | ICD-10-CM | POA: Diagnosis not present

## 2019-12-22 DIAGNOSIS — D225 Melanocytic nevi of trunk: Secondary | ICD-10-CM | POA: Diagnosis not present

## 2020-02-24 ENCOUNTER — Other Ambulatory Visit: Payer: Self-pay

## 2020-02-24 ENCOUNTER — Other Ambulatory Visit: Payer: BC Managed Care – PPO

## 2020-02-24 DIAGNOSIS — Z20822 Contact with and (suspected) exposure to covid-19: Secondary | ICD-10-CM

## 2020-02-25 LAB — NOVEL CORONAVIRUS, NAA: SARS-CoV-2, NAA: NOT DETECTED

## 2020-02-25 LAB — SARS-COV-2, NAA 2 DAY TAT

## 2020-02-27 DIAGNOSIS — F4322 Adjustment disorder with anxiety: Secondary | ICD-10-CM | POA: Diagnosis not present

## 2020-02-27 DIAGNOSIS — F3342 Major depressive disorder, recurrent, in full remission: Secondary | ICD-10-CM | POA: Diagnosis not present

## 2020-02-27 DIAGNOSIS — G4700 Insomnia, unspecified: Secondary | ICD-10-CM | POA: Diagnosis not present

## 2020-04-02 DIAGNOSIS — F4320 Adjustment disorder, unspecified: Secondary | ICD-10-CM | POA: Diagnosis not present

## 2020-04-12 DIAGNOSIS — F4320 Adjustment disorder, unspecified: Secondary | ICD-10-CM | POA: Diagnosis not present

## 2020-04-19 DIAGNOSIS — G4700 Insomnia, unspecified: Secondary | ICD-10-CM | POA: Diagnosis not present

## 2020-04-19 DIAGNOSIS — F411 Generalized anxiety disorder: Secondary | ICD-10-CM | POA: Diagnosis not present

## 2020-04-19 DIAGNOSIS — F3342 Major depressive disorder, recurrent, in full remission: Secondary | ICD-10-CM | POA: Diagnosis not present

## 2020-04-26 ENCOUNTER — Encounter: Payer: Self-pay | Admitting: Family

## 2020-04-28 ENCOUNTER — Encounter: Payer: Self-pay | Admitting: Family

## 2020-04-30 DIAGNOSIS — F4320 Adjustment disorder, unspecified: Secondary | ICD-10-CM | POA: Diagnosis not present

## 2020-05-11 ENCOUNTER — Ambulatory Visit: Payer: BC Managed Care – PPO | Admitting: Family

## 2020-05-18 ENCOUNTER — Ambulatory Visit (INDEPENDENT_AMBULATORY_CARE_PROVIDER_SITE_OTHER): Payer: BC Managed Care – PPO | Admitting: Internal Medicine

## 2020-05-18 ENCOUNTER — Encounter: Payer: Self-pay | Admitting: Internal Medicine

## 2020-05-18 ENCOUNTER — Other Ambulatory Visit: Payer: Self-pay

## 2020-05-18 VITALS — BP 116/72 | HR 65 | Temp 98.1°F | Ht 76.0 in | Wt 183.0 lb

## 2020-05-18 DIAGNOSIS — Z0000 Encounter for general adult medical examination without abnormal findings: Secondary | ICD-10-CM | POA: Diagnosis not present

## 2020-05-18 DIAGNOSIS — Z23 Encounter for immunization: Secondary | ICD-10-CM

## 2020-05-18 DIAGNOSIS — B001 Herpesviral vesicular dermatitis: Secondary | ICD-10-CM

## 2020-05-18 DIAGNOSIS — R7989 Other specified abnormal findings of blood chemistry: Secondary | ICD-10-CM | POA: Diagnosis not present

## 2020-05-18 LAB — CBC WITH DIFFERENTIAL/PLATELET
Basophils Absolute: 0.1 10*3/uL (ref 0.0–0.1)
Basophils Relative: 1.1 % (ref 0.0–3.0)
Eosinophils Absolute: 0.1 10*3/uL (ref 0.0–0.7)
Eosinophils Relative: 2.1 % (ref 0.0–5.0)
HCT: 40.2 % (ref 39.0–52.0)
Hemoglobin: 13.6 g/dL (ref 13.0–17.0)
Lymphocytes Relative: 38.3 % (ref 12.0–46.0)
Lymphs Abs: 1.9 10*3/uL (ref 0.7–4.0)
MCHC: 33.8 g/dL (ref 30.0–36.0)
MCV: 90.5 fl (ref 78.0–100.0)
Monocytes Absolute: 0.4 10*3/uL (ref 0.1–1.0)
Monocytes Relative: 9.2 % (ref 3.0–12.0)
Neutro Abs: 2.4 10*3/uL (ref 1.4–7.7)
Neutrophils Relative %: 49.3 % (ref 43.0–77.0)
Platelets: 205 10*3/uL (ref 150.0–400.0)
RBC: 4.44 Mil/uL (ref 4.22–5.81)
RDW: 11.8 % (ref 11.5–15.5)
WBC: 4.9 10*3/uL (ref 4.0–10.5)

## 2020-05-18 LAB — PROTIME-INR
INR: 1.1 ratio — ABNORMAL HIGH (ref 0.8–1.0)
Prothrombin Time: 11.8 s (ref 9.6–13.1)

## 2020-05-18 LAB — BASIC METABOLIC PANEL
BUN: 12 mg/dL (ref 6–23)
CO2: 31 mEq/L (ref 19–32)
Calcium: 9.1 mg/dL (ref 8.4–10.5)
Chloride: 102 mEq/L (ref 96–112)
Creatinine, Ser: 0.91 mg/dL (ref 0.40–1.50)
GFR: 109.34 mL/min (ref >60.00)
Glucose, Bld: 97 mg/dL (ref 70–99)
Potassium: 4.7 mEq/L (ref 3.5–5.1)
Sodium: 138 mEq/L (ref 135–145)

## 2020-05-18 LAB — HEPATIC FUNCTION PANEL
ALT: 20 U/L (ref 0–53)
AST: 21 U/L (ref 0–37)
Albumin: 4.4 g/dL (ref 3.5–5.2)
Alkaline Phosphatase: 48 U/L (ref 39–117)
Bilirubin, Direct: 0.1 mg/dL (ref 0.0–0.3)
Total Bilirubin: 0.7 mg/dL (ref 0.2–1.2)
Total Protein: 6.7 g/dL (ref 6.0–8.3)

## 2020-05-18 NOTE — Progress Notes (Signed)
Subjective:  Patient ID: Zachary Becker, male    DOB: [birth year]  Age: 34 y.o. MRN: [id number]  CC: Annual Exam  This visit occurred during the SARS-CoV-2 public health emergency.  Safety protocols were in place, including screening questions prior to the visit, additional usage of staff PPE, and extensive cleaning of exam room while observing appropriate contact time as indicated for disinfecting solutions.    HPI [employer] Becker presents for a CPX.  He has a remote hx of elevated LFT's. He has been vaccinated against Hep A and B. He feels well, offers no complaints.  Outpatient Medications Prior to Visit  Medication Sig Dispense Refill  . DAYVIGO 10 MG TABS     . gabapentin (NEURONTIN) 600 MG tablet     . valACYclovir (VALTREX) 1000 MG tablet Take 1 tablet (1,000 mg total) by mouth 3 (three) times daily. 15 tablet 1  . valACYclovir (VALTREX) 500 MG tablet Take 1 tablet (500 mg total) by mouth daily.    Marland Kitchen ALPRAZolam (XANAX) 0.5 MG tablet Take 0.5 mg by mouth 2 (two) times daily as needed.    Marland Kitchen buPROPion (WELLBUTRIN XL) 300 MG 24 hr tablet Take 300 mg by mouth daily.    Marland Kitchen doxycycline (VIBRAMYCIN) 100 MG capsule Take 1 capsule (100 mg total) by mouth 2 (two) times daily. 14 capsule 0  . propranolol (INDERAL) 10 MG tablet TAKE ONE TABLET BY MOUTH TWICE A DAY 60 tablet 2  . Tadalafil 2.5 MG TABS Take 1 tablet by mouth daily.     No facility-administered medications prior to visit.    ROS Review of Systems  Constitutional: Negative for appetite change, diaphoresis, fatigue and unexpected weight change.  HENT: Negative.   Eyes: Negative.   Respiratory: Negative for cough and chest tightness.   Cardiovascular: Negative for chest pain.  Gastrointestinal: Negative for abdominal pain, diarrhea, nausea and vomiting.  Endocrine: Negative.   Genitourinary: Negative.  Negative for difficulty urinating, discharge, dysuria, hematuria, scrotal swelling, testicular pain and  urgency.  Musculoskeletal: Negative.  Negative for myalgias.  Skin: Negative.  Negative for color change and pallor.  Neurological: Negative.  Negative for dizziness, weakness, light-headedness and headaches.  Hematological: Negative for adenopathy. Does not bruise/bleed easily.  Psychiatric/Behavioral: Negative.     Objective:  BP 116/72   Pulse 65   Temp 98.1 F (36.7 C) (Oral)   Ht 6\' 4"  (1.93 m)   Wt 183 lb (83 kg)   SpO2 98%   BMI 22.28 kg/m   BP Readings from Last 3 Encounters:  05/18/20 116/72  09/22/19 118/72  06/18/18 110/78    Wt Readings from Last 3 Encounters:  05/18/20 183 lb (83 kg)  09/22/19 181 lb (82.1 kg)  06/18/18 214 lb (97.1 kg)    Physical Exam Vitals reviewed.  Constitutional:      Appearance: Normal appearance.  HENT:     Nose: Nose normal.     Mouth/Throat:     Mouth: Mucous membranes are moist.     Pharynx: No oropharyngeal exudate.  Eyes:     General: No scleral icterus.    Conjunctiva/sclera: Conjunctivae normal.  Cardiovascular:     Rate and Rhythm: Normal rate and regular rhythm.     Heart sounds: No murmur heard.   Pulmonary:     Effort: Pulmonary effort is normal.     Breath sounds: No stridor. No wheezing, rhonchi or rales.  Abdominal:     General: Abdomen is flat.  Palpations: There is no mass.     Tenderness: There is no abdominal tenderness. There is no guarding.  Musculoskeletal:        General: Normal range of motion.     Cervical back: Neck supple.  Lymphadenopathy:     Cervical: No cervical adenopathy.  Skin:    General: Skin is warm and dry.     Coloration: Skin is not pale.  Neurological:     General: No focal deficit present.     Mental Status: He is alert.  Psychiatric:        Mood and Affect: Mood normal.     Lab Results  Component Value Date   WBC 4.9 05/18/2020   HGB 13.6 05/18/2020   HCT 40.2 05/18/2020   PLT 205.0 05/18/2020   GLUCOSE 97 05/18/2020   CHOL 172 05/14/2019   TRIG 67  05/14/2019   HDL 42 05/14/2019   LDLCALC 129 05/14/2019   ALT 20 05/18/2020   AST 21 05/18/2020   NA 138 05/18/2020   K 4.7 05/18/2020   CL 102 05/18/2020   CREATININE 0.91 05/18/2020   BUN 12 05/18/2020   CO2 31 05/18/2020   TSH 1.39 05/14/2019   INR 1.1 (H) 05/18/2020    No results found.  Assessment & Plan:   Yovanny was seen today for annual exam.  Diagnoses and all orders for this visit:  Routine general medical examination at a health care facility- Exam completed, labs reviewed, vaccines reviewed and updated, pt ed material was given.  Herpes labialis without complication- His outbreaks are well controlled with valtrex. -     CBC with Differential/Platelet; Future -     Basic metabolic panel; Future -     Basic metabolic panel -     CBC with Differential/Platelet  Elevated LFTs- His LFT's are normal. Screening for viral hepatitis is negative. He has excellent immunity to Hep A and B. -     CBC with Differential/Platelet; Future -     Basic metabolic panel; Future -     Hepatitis B surface antigen; Future -     Hepatitis B surface antibody,quantitative; Future -     Hepatitis A antibody, total; Future -     Hepatitis B core antibody, total; Future -     Hepatitis C antibody; Future -     Hepatic function panel; Future -     Protime-INR; Future -     Protime-INR -     Hepatic function panel -     Hepatitis C antibody -     Hepatitis B core antibody, total -     Hepatitis A antibody, total -     Hepatitis B surface antibody,quantitative -     Hepatitis B surface antigen -     Basic metabolic panel -     CBC with Differential/Platelet  Other orders -     HPV 9-valent vaccine,Recombinat   I have discontinued Kempton T. Kaine Becker "Tal"'s propranolol, buPROPion, Tadalafil, ALPRAZolam, and doxycycline. I am also having him maintain his gabapentin, DayVigo, valACYclovir, and valACYclovir.  No orders of the defined types were placed in this encounter.     Follow-up: Return in about 6 months (around 11/15/2020).  Scarlette Calico, MD

## 2020-05-18 NOTE — Patient Instructions (Signed)

## 2020-05-19 ENCOUNTER — Encounter: Payer: Self-pay | Admitting: Internal Medicine

## 2020-05-19 LAB — HEPATITIS B SURFACE ANTIGEN: Hepatitis B Surface Ag: NONREACTIVE

## 2020-05-19 LAB — HEPATITIS A ANTIBODY, TOTAL: Hepatitis A AB,Total: REACTIVE — AB

## 2020-05-19 LAB — HEPATITIS B CORE ANTIBODY, TOTAL: Hep B Core Total Ab: NONREACTIVE

## 2020-05-19 LAB — HEPATITIS C ANTIBODY
Hepatitis C Ab: NONREACTIVE
SIGNAL TO CUT-OFF: 0.01 (ref <1.00)

## 2020-05-19 LAB — HEPATITIS B SURFACE ANTIBODY, QUANTITATIVE: Hep B S AB Quant (Post): >1000 m[IU]/mL (ref >=10)

## 2020-05-21 ENCOUNTER — Ambulatory Visit: Payer: BC Managed Care – PPO | Admitting: Family

## 2020-06-18 ENCOUNTER — Encounter: Payer: Self-pay | Admitting: Internal Medicine

## 2020-06-23 ENCOUNTER — Ambulatory Visit: Payer: BC Managed Care – PPO

## 2020-06-23 ENCOUNTER — Other Ambulatory Visit: Payer: Self-pay

## 2020-06-23 ENCOUNTER — Ambulatory Visit (INDEPENDENT_AMBULATORY_CARE_PROVIDER_SITE_OTHER): Payer: BC Managed Care – PPO

## 2020-06-23 DIAGNOSIS — Z23 Encounter for immunization: Secondary | ICD-10-CM

## 2020-07-07 ENCOUNTER — Encounter: Payer: Self-pay | Admitting: Family

## 2020-07-28 DIAGNOSIS — F4320 Adjustment disorder, unspecified: Secondary | ICD-10-CM | POA: Diagnosis not present

## 2020-08-30 DIAGNOSIS — F4320 Adjustment disorder, unspecified: Secondary | ICD-10-CM | POA: Diagnosis not present

## 2020-09-13 DIAGNOSIS — F4320 Adjustment disorder, unspecified: Secondary | ICD-10-CM | POA: Diagnosis not present

## 2020-09-27 DIAGNOSIS — F4320 Adjustment disorder, unspecified: Secondary | ICD-10-CM | POA: Diagnosis not present

## 2020-10-11 DIAGNOSIS — F4320 Adjustment disorder, unspecified: Secondary | ICD-10-CM | POA: Diagnosis not present

## 2020-10-26 DIAGNOSIS — F411 Generalized anxiety disorder: Secondary | ICD-10-CM | POA: Diagnosis not present

## 2020-10-26 DIAGNOSIS — F331 Major depressive disorder, recurrent, moderate: Secondary | ICD-10-CM | POA: Diagnosis not present

## 2020-10-26 DIAGNOSIS — G4700 Insomnia, unspecified: Secondary | ICD-10-CM | POA: Diagnosis not present

## 2020-11-08 DIAGNOSIS — F4320 Adjustment disorder, unspecified: Secondary | ICD-10-CM | POA: Diagnosis not present

## 2020-11-18 IMAGING — US US ABDOMEN LIMITED
1 series · 14 of 25 positions shown · non-contrast
Comparison: None.

CLINICAL DATA: Elevated liver function tests

EXAM:
ULTRASOUND ABDOMEN LIMITED RIGHT UPPER QUADRANT

[Series 1: us abdomen limited · 0.20mm/px · 14 of 46 slices shown]
[im 1/46]
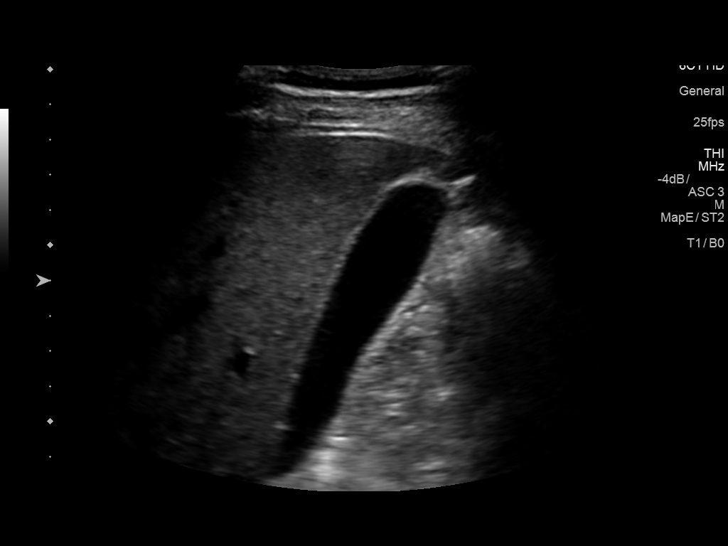
[im 4/46]
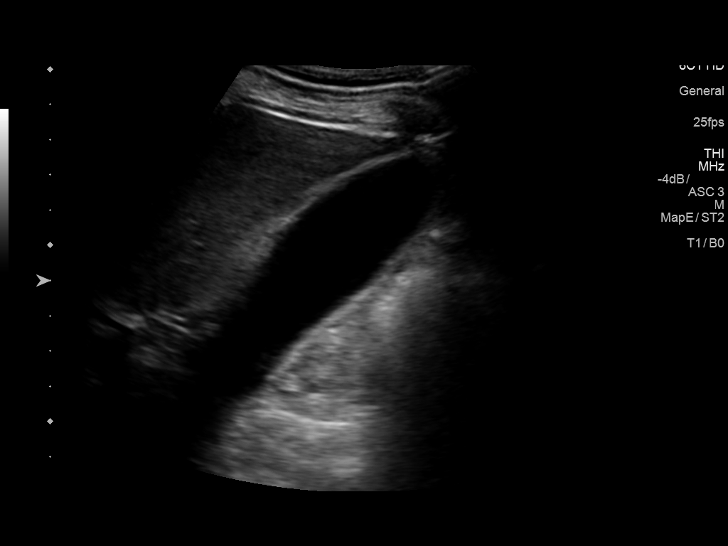
[im 8/46]
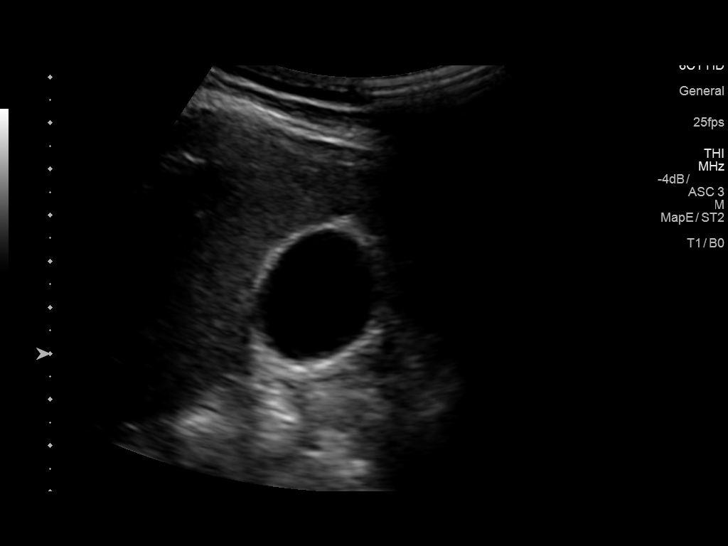
[im 12/46]
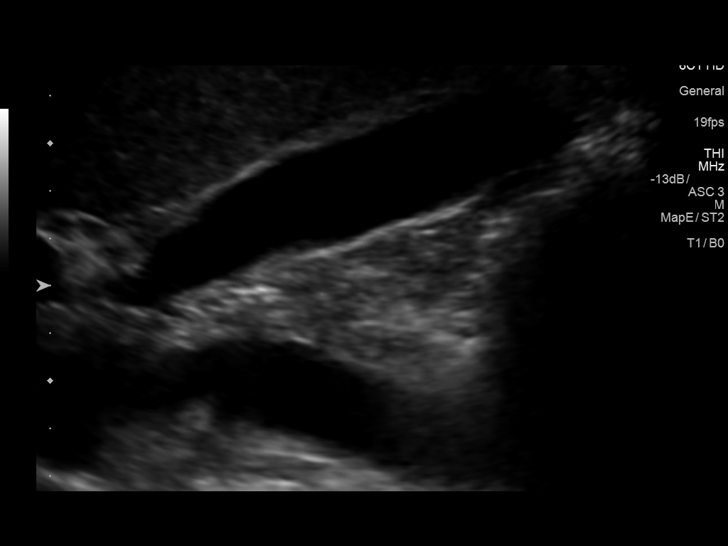
[im 16/46]
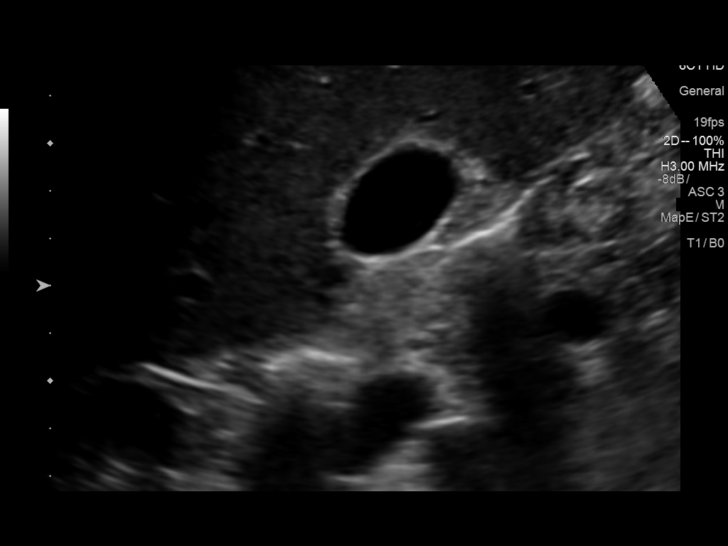
[im 17/46]
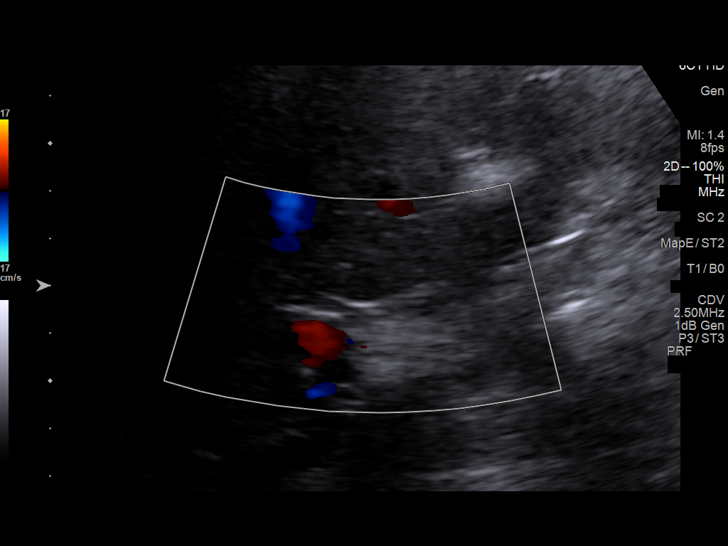
[im 21/46]
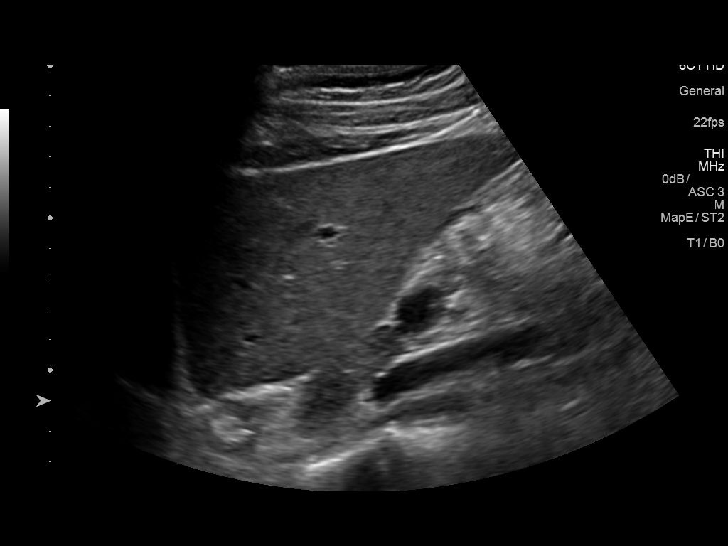
[im 25/46]
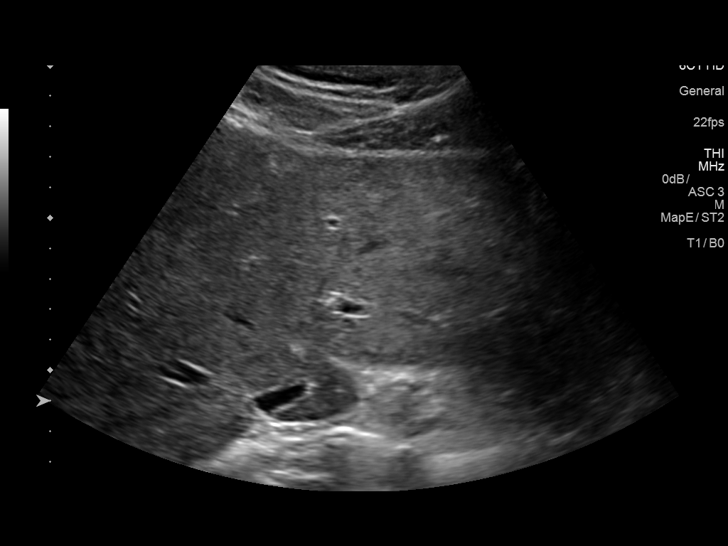
[im 29/46]
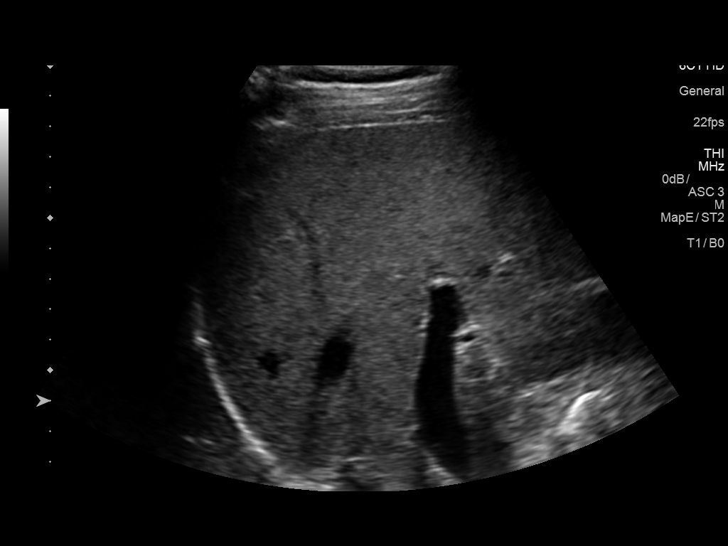
[im 31/46]
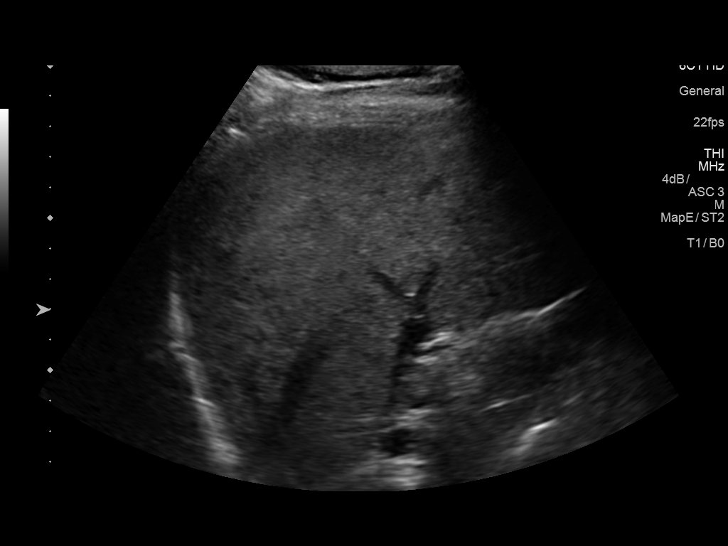
[im 34/46]
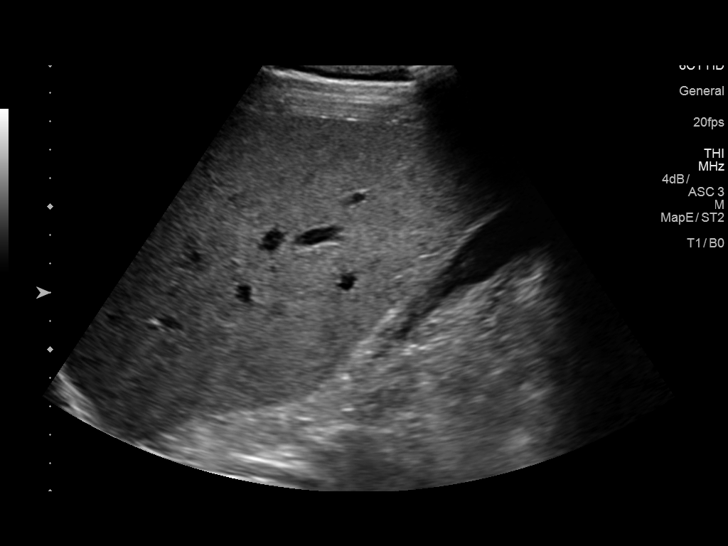
[im 38/46]
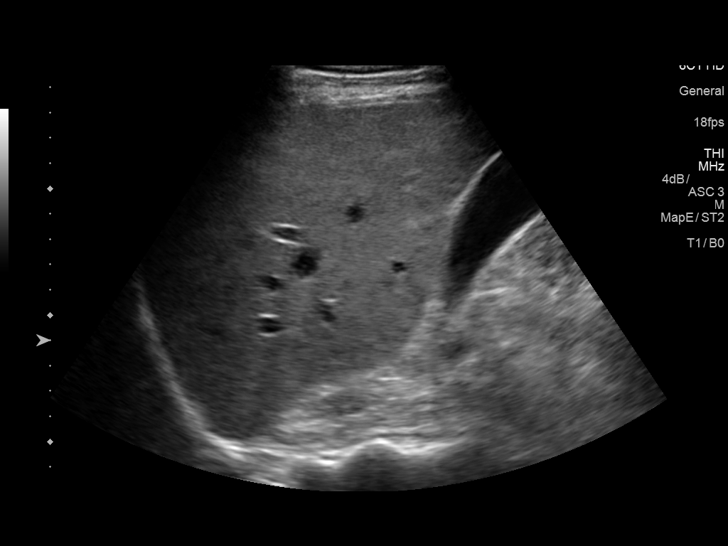
[im 42/46]
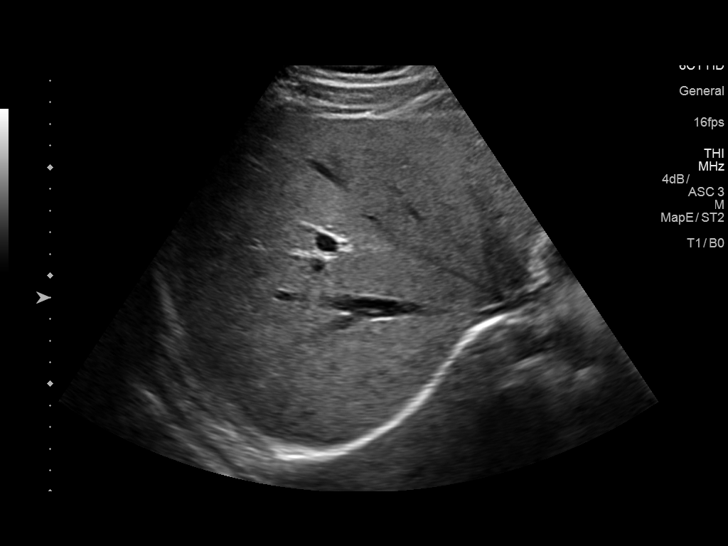
[im 46/46]
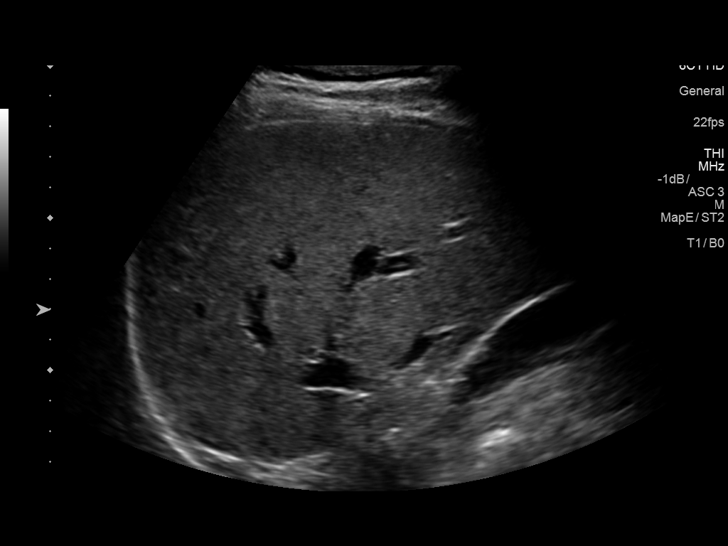

[14 of 25 positions shown; findings below may reference images not displayed]

FINDINGS: Gallbladder:

The gallbladder is visualized and no gallstones are noted. There is
no pain over the gallbladder with compression.

Common bile duct:

Diameter: The common bile duct is normal measuring 2.7 mm in
diameter.

Liver:

The parenchyma of the liver may be minimally a echogenic and
slightly inhomogeneous. This could indicate very mild change of
hepatic steatosis but no focal abnormality is seen within the liver.
Portal vein is patent on color Doppler imaging with normal direction
of blood flow towards the liver.
IMPRESSION: 1. No gallstones.
2. Very minimal prominence of the hepatic parenchyma could indicate
mild hepatic steatosis. No focal abnormality is seen however.

## 2020-12-08 ENCOUNTER — Encounter: Payer: Self-pay | Admitting: Internal Medicine

## 2020-12-08 DIAGNOSIS — Z20822 Contact with and (suspected) exposure to covid-19: Secondary | ICD-10-CM | POA: Diagnosis not present

## 2020-12-10 DIAGNOSIS — F4320 Adjustment disorder, unspecified: Secondary | ICD-10-CM | POA: Diagnosis not present

## 2020-12-14 DIAGNOSIS — F4320 Adjustment disorder, unspecified: Secondary | ICD-10-CM | POA: Diagnosis not present

## 2020-12-20 DIAGNOSIS — F4320 Adjustment disorder, unspecified: Secondary | ICD-10-CM | POA: Diagnosis not present

## 2020-12-21 DIAGNOSIS — L814 Other melanin hyperpigmentation: Secondary | ICD-10-CM | POA: Diagnosis not present

## 2020-12-21 DIAGNOSIS — D225 Melanocytic nevi of trunk: Secondary | ICD-10-CM | POA: Diagnosis not present

## 2020-12-21 DIAGNOSIS — L905 Scar conditions and fibrosis of skin: Secondary | ICD-10-CM | POA: Diagnosis not present

## 2020-12-21 DIAGNOSIS — B0089 Other herpesviral infection: Secondary | ICD-10-CM | POA: Diagnosis not present

## 2020-12-28 DIAGNOSIS — F4320 Adjustment disorder, unspecified: Secondary | ICD-10-CM | POA: Diagnosis not present

## 2020-12-29 DIAGNOSIS — F3342 Major depressive disorder, recurrent, in full remission: Secondary | ICD-10-CM | POA: Diagnosis not present

## 2020-12-30 ENCOUNTER — Telehealth: Payer: Self-pay | Admitting: Internal Medicine

## 2020-12-30 NOTE — Telephone Encounter (Signed)
Called pt, LVM.   

## 2020-12-30 NOTE — Telephone Encounter (Signed)
    Patient is requesting an STI screening in July and was wondering if lab orders could be placed. He can be reached at [phone]

## 2021-01-03 DIAGNOSIS — F4320 Adjustment disorder, unspecified: Secondary | ICD-10-CM | POA: Diagnosis not present

## 2021-01-14 ENCOUNTER — Ambulatory Visit: Payer: BC Managed Care – PPO | Admitting: Internal Medicine

## 2021-01-17 DIAGNOSIS — F4320 Adjustment disorder, unspecified: Secondary | ICD-10-CM | POA: Diagnosis not present

## 2021-01-21 ENCOUNTER — Ambulatory Visit: Payer: BC Managed Care – PPO | Admitting: Internal Medicine

## 2021-01-21 ENCOUNTER — Encounter: Payer: Self-pay | Admitting: Internal Medicine

## 2021-01-21 ENCOUNTER — Other Ambulatory Visit: Payer: Self-pay

## 2021-01-21 VITALS — BP 120/70 | HR 79 | Temp 98.2°F | Resp 18 | Ht 76.0 in | Wt 191.8 lb

## 2021-01-21 DIAGNOSIS — Z113 Encounter for screening for infections with a predominantly sexual mode of transmission: Secondary | ICD-10-CM | POA: Diagnosis not present

## 2021-01-21 NOTE — Assessment & Plan Note (Signed)
Screening ordered today. We did talk about methods of STD prevention including condoms and prep. He is not interested in prep today and makes lifestyle choices to limit exposure.

## 2021-01-21 NOTE — Progress Notes (Signed)
   Subjective:   Patient ID: Zachary Becker, male    DOB: February 16, 1986, 35 y.o.   MRN: [id number]  HPI Wants STI screening. Past oral herpes wants full panel. 2 partners over the summer anticipating getting back with long term partner this fall and wants testing. No symptoms.   Review of Systems  Constitutional: Negative.   HENT: Negative.    Eyes: Negative.   Respiratory:  Negative for cough, chest tightness and shortness of breath.   Cardiovascular:  Negative for chest pain, palpitations and leg swelling.  Gastrointestinal:  Negative for abdominal distention, abdominal pain, constipation, diarrhea, nausea and vomiting.  Musculoskeletal: Negative.   Skin: Negative.   Neurological: Negative.   Psychiatric/Behavioral: Negative.     Objective:  Physical Exam Constitutional:      Appearance: He is well-developed.  HENT:     Head: Normocephalic and atraumatic.  Cardiovascular:     Rate and Rhythm: Normal rate and regular rhythm.  Pulmonary:     Effort: Pulmonary effort is normal. No respiratory distress.     Breath sounds: Normal breath sounds. No wheezing or rales.  Abdominal:     General: Bowel sounds are normal. There is no distension.     Palpations: Abdomen is soft.     Tenderness: There is no abdominal tenderness. There is no rebound.  Musculoskeletal:     Cervical back: Normal range of motion.  Skin:    General: Skin is warm and dry.  Neurological:     Mental Status: He is alert and oriented to person, place, and time.     Coordination: Coordination normal.    Vitals:   01/21/21 1418  BP: 120/70  Pulse: 79  Resp: 18  Temp: 98.2 F (36.8 C)  TempSrc: Oral  SpO2: 96%  Weight: 191 lb 12.8 oz (87 kg)  Height: 6\' 4"  (1.93 m)    This visit occurred during the SARS-CoV-2 public health emergency.  Safety protocols were in place, including screening questions prior to the visit, additional usage of staff PPE, and extensive cleaning of exam room while observing  appropriate contact time as indicated for disinfecting solutions.   Assessment & Plan:

## 2021-01-21 NOTE — Patient Instructions (Addendum)
We will order the testing and you can let us know on mychart if you need this again.

## 2021-01-24 ENCOUNTER — Encounter: Payer: Self-pay | Admitting: Internal Medicine

## 2021-01-24 LAB — HIV ANTIBODY (ROUTINE TESTING W REFLEX): HIV 1&2 Ab, 4th Generation: NONREACTIVE

## 2021-01-24 LAB — HSV 1 ANTIBODY, IGG: HSV 1 Glycoprotein G Ab, IgG: 31 index — ABNORMAL HIGH

## 2021-01-24 LAB — RPR: RPR Ser Ql: NONREACTIVE

## 2021-01-24 LAB — GC/CHLAMYDIA PROBE AMP
Chlamydia trachomatis, NAA: NEGATIVE
Neisseria Gonorrhoeae by PCR: NEGATIVE

## 2021-01-24 LAB — HSV 2 ANTIBODY, IGG: HSV 2 Glycoprotein G Ab, IgG: <0.9 index

## 2021-01-28 DIAGNOSIS — F4320 Adjustment disorder, unspecified: Secondary | ICD-10-CM | POA: Diagnosis not present

## 2021-01-31 DIAGNOSIS — Z202 Contact with and (suspected) exposure to infections with a predominantly sexual mode of transmission: Secondary | ICD-10-CM | POA: Diagnosis not present

## 2021-01-31 DIAGNOSIS — J029 Acute pharyngitis, unspecified: Secondary | ICD-10-CM | POA: Diagnosis not present

## 2021-02-04 DIAGNOSIS — R21 Rash and other nonspecific skin eruption: Secondary | ICD-10-CM | POA: Diagnosis not present

## 2021-02-04 DIAGNOSIS — L259 Unspecified contact dermatitis, unspecified cause: Secondary | ICD-10-CM | POA: Diagnosis not present

## 2021-02-04 DIAGNOSIS — Z113 Encounter for screening for infections with a predominantly sexual mode of transmission: Secondary | ICD-10-CM | POA: Diagnosis not present

## 2021-02-11 ENCOUNTER — Ambulatory Visit: Payer: Self-pay | Admitting: Nurse Practitioner

## 2021-02-11 ENCOUNTER — Other Ambulatory Visit: Payer: Self-pay

## 2021-02-11 VITALS — BP 116/82 | HR 78 | Temp 97.5°F | Resp 16 | Ht 75.75 in | Wt 192.0 lb

## 2021-02-11 DIAGNOSIS — B349 Viral infection, unspecified: Secondary | ICD-10-CM

## 2021-02-11 DIAGNOSIS — J029 Acute pharyngitis, unspecified: Secondary | ICD-10-CM

## 2021-02-11 LAB — POC COVID19 BINAXNOW: SARS Coronavirus 2 Ag: NEGATIVE

## 2021-02-11 NOTE — Progress Notes (Signed)
   Subjective:    Patient ID: Zachary Becker, male    DOB: 1985/11/25, 35 y.o.   MRN: [id number]  HPI  35 year old male presenting to [facility] with symptoms of aching in back, slight sore throat with some lymph node irritation in cervical region and fatigue. He has felt run down for the past day. 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 Wears a mask about 50% of the time, has been around more people in and out of office.   He had COVID in 12/2020.   Today's Vitals   02/11/21 1145  BP: 116/82  Pulse: 78  Resp: 16  Temp: (!) 97.5 F (36.4 C)  TempSrc: Temporal  SpO2: 98%  Weight: 192 lb (87.1 kg)  Height: 6' 3.75" (1.924 m)   Body mass index is 23.53 kg/m.   Review of Systems  Constitutional:  Positive for fatigue.  HENT:  Positive for sore throat.   Respiratory: Negative.    Cardiovascular: Negative.   Genitourinary: Negative.   Musculoskeletal:  Positive for myalgias.  Skin: Negative.   Neurological: Negative.       Objective:   Physical Exam Constitutional:      Appearance: He is well-developed.  HENT:     Head: Normocephalic.     Right Ear: Tympanic membrane and ear canal normal.     Left Ear: Tympanic membrane and ear canal normal.     Mouth/Throat:     Mouth: Mucous membranes are moist.     Tonsils: No tonsillar exudate.  Eyes:     Pupils: Pupils are equal, round, and reactive to light.  Cardiovascular:     Rate and Rhythm: Normal rate and regular rhythm.     Heart sounds: Normal heart sounds.  Pulmonary:     Effort: Pulmonary effort is normal.     Breath sounds: Normal breath sounds.  Musculoskeletal:     Cervical back: Normal range of motion.  Skin:    General: Skin is warm.     Capillary Refill: Capillary refill takes less than 2 seconds.  Neurological:     General: No focal deficit present.     Mental Status: He is alert.      Component Ref Range & Units 13:04   SARS Coronavirus 2 Ag Negative Negative           Specimen Collected: 02/11/21 13:04 Last Resulted: 02/11/21 13:04           Assessment & Plan:  1. Sore throat  - POC COVID-19 rapid antigen negative. Given recent known COVID illness will not send out PCR today. RTC with any ongoing symptoms or new concerns.   2. Viral illness Advised OTC management of symptoms including self care, hydration, adequate caloric intake, rest and vitamins (D3 and C) for immune support.

## 2021-02-14 DIAGNOSIS — F4320 Adjustment disorder, unspecified: Secondary | ICD-10-CM | POA: Diagnosis not present

## 2021-02-21 ENCOUNTER — Encounter: Payer: Self-pay | Admitting: Nurse Practitioner

## 2021-02-21 DIAGNOSIS — F4320 Adjustment disorder, unspecified: Secondary | ICD-10-CM | POA: Diagnosis not present

## 2021-02-23 ENCOUNTER — Other Ambulatory Visit: Payer: Self-pay | Admitting: Nurse Practitioner

## 2021-02-23 DIAGNOSIS — Z113 Encounter for screening for infections with a predominantly sexual mode of transmission: Secondary | ICD-10-CM

## 2021-02-24 DIAGNOSIS — Z113 Encounter for screening for infections with a predominantly sexual mode of transmission: Secondary | ICD-10-CM | POA: Diagnosis not present

## 2021-02-24 DIAGNOSIS — Z114 Encounter for screening for human immunodeficiency virus [HIV]: Secondary | ICD-10-CM | POA: Diagnosis not present

## 2021-02-28 DIAGNOSIS — F4320 Adjustment disorder, unspecified: Secondary | ICD-10-CM | POA: Diagnosis not present

## 2021-03-07 ENCOUNTER — Other Ambulatory Visit: Payer: BC Managed Care – PPO

## 2021-03-11 ENCOUNTER — Other Ambulatory Visit: Payer: Self-pay

## 2021-03-11 ENCOUNTER — Other Ambulatory Visit: Payer: BC Managed Care – PPO

## 2021-03-11 DIAGNOSIS — Z113 Encounter for screening for infections with a predominantly sexual mode of transmission: Secondary | ICD-10-CM

## 2021-03-11 NOTE — Progress Notes (Signed)
Lab work

## 2021-03-12 ENCOUNTER — Encounter: Payer: Self-pay | Admitting: Nurse Practitioner

## 2021-03-12 LAB — HIV ANTIBODY (ROUTINE TESTING W REFLEX): HIV Screen 4th Generation wRfx: NONREACTIVE

## 2021-03-14 DIAGNOSIS — F4320 Adjustment disorder, unspecified: Secondary | ICD-10-CM | POA: Diagnosis not present

## 2021-04-08 DIAGNOSIS — Z23 Encounter for immunization: Secondary | ICD-10-CM | POA: Diagnosis not present

## 2021-04-08 DIAGNOSIS — F411 Generalized anxiety disorder: Secondary | ICD-10-CM | POA: Diagnosis not present

## 2021-04-08 DIAGNOSIS — G4700 Insomnia, unspecified: Secondary | ICD-10-CM | POA: Diagnosis not present

## 2021-04-11 DIAGNOSIS — F330 Major depressive disorder, recurrent, mild: Secondary | ICD-10-CM | POA: Diagnosis not present

## 2021-04-25 DIAGNOSIS — F330 Major depressive disorder, recurrent, mild: Secondary | ICD-10-CM | POA: Diagnosis not present

## 2021-04-25 DIAGNOSIS — Z23 Encounter for immunization: Secondary | ICD-10-CM | POA: Diagnosis not present

## 2021-05-09 DIAGNOSIS — F330 Major depressive disorder, recurrent, mild: Secondary | ICD-10-CM | POA: Diagnosis not present

## 2021-05-23 DIAGNOSIS — F330 Major depressive disorder, recurrent, mild: Secondary | ICD-10-CM | POA: Diagnosis not present

## 2021-06-20 DIAGNOSIS — F330 Major depressive disorder, recurrent, mild: Secondary | ICD-10-CM | POA: Diagnosis not present

## 2021-06-27 ENCOUNTER — Other Ambulatory Visit: Payer: Self-pay | Admitting: Nurse Practitioner

## 2021-06-29 ENCOUNTER — Other Ambulatory Visit: Payer: Self-pay

## 2021-06-29 ENCOUNTER — Other Ambulatory Visit: Payer: BC Managed Care – PPO

## 2021-06-29 DIAGNOSIS — Z1159 Encounter for screening for other viral diseases: Secondary | ICD-10-CM

## 2021-06-29 DIAGNOSIS — E559 Vitamin D deficiency, unspecified: Secondary | ICD-10-CM

## 2021-06-29 DIAGNOSIS — Z0000 Encounter for general adult medical examination without abnormal findings: Secondary | ICD-10-CM

## 2021-06-29 DIAGNOSIS — Z114 Encounter for screening for human immunodeficiency virus [HIV]: Secondary | ICD-10-CM

## 2021-06-30 ENCOUNTER — Encounter: Payer: Self-pay | Admitting: Nurse Practitioner

## 2021-06-30 LAB — CMP12+LP+TP+TSH+6AC+PSA+CBC…
ALT: 14 IU/L (ref 0–44)
AST: 18 IU/L (ref 0–40)
Albumin/Globulin Ratio: 2 (ref 1.2–2.2)
Albumin: 4.4 g/dL (ref 4.0–5.0)
Alkaline Phosphatase: 50 IU/L (ref 44–121)
BUN/Creatinine Ratio: 17 (ref 9–20)
BUN: 17 mg/dL (ref 6–20)
Basophils Absolute: 0.1 10*3/uL (ref 0.0–0.2)
Basos: 1 %
Bilirubin Total: <0.2 mg/dL (ref 0.0–1.2)
Calcium: 8.8 mg/dL (ref 8.7–10.2)
Chloride: 100 mmol/L (ref 96–106)
Chol/HDL Ratio: 3.3 ratio (ref 0.0–5.0)
Cholesterol, Total: 145 mg/dL (ref 100–199)
Creatinine, Ser: 1.03 mg/dL (ref 0.76–1.27)
EOS (ABSOLUTE): 0.1 10*3/uL (ref 0.0–0.4)
Eos: 2 %
Estimated CHD Risk: <0.5 times avg. (ref 0.0–1.0)
Free Thyroxine Index: 1.8 (ref 1.2–4.9)
GGT: 7 IU/L (ref 0–65)
Globulin, Total: 2.2 g/dL (ref 1.5–4.5)
Glucose: 101 mg/dL — ABNORMAL HIGH (ref 70–99)
HDL: 44 mg/dL (ref >39)
Hematocrit: 40.7 % (ref 37.5–51.0)
Hemoglobin: 13.9 g/dL (ref 13.0–17.7)
Immature Grans (Abs): 0 10*3/uL (ref 0.0–0.1)
Immature Granulocytes: 0 %
Iron: 39 ug/dL (ref 38–169)
LDH: 122 IU/L (ref 121–224)
LDL Chol Calc (NIH): 87 mg/dL (ref 0–99)
Lymphocytes Absolute: 2.1 10*3/uL (ref 0.7–3.1)
Lymphs: 38 %
MCH: 30.3 pg (ref 26.6–33.0)
MCHC: 34.2 g/dL (ref 31.5–35.7)
MCV: 89 fL (ref 79–97)
Monocytes Absolute: 0.5 10*3/uL (ref 0.1–0.9)
Monocytes: 9 %
Neutrophils Absolute: 2.7 10*3/uL (ref 1.4–7.0)
Neutrophils: 50 %
Phosphorus: 4.4 mg/dL — ABNORMAL HIGH (ref 2.8–4.1)
Platelets: 213 10*3/uL (ref 150–450)
Potassium: 4.7 mmol/L (ref 3.5–5.2)
Prostate Specific Ag, Serum: 0.5 ng/mL (ref 0.0–4.0)
RBC: 4.59 x10E6/uL (ref 4.14–5.80)
RDW: 11.9 % (ref 11.6–15.4)
Sodium: 139 mmol/L (ref 134–144)
T3 Uptake Ratio: 28 % (ref 24–39)
T4, Total: 6.4 ug/dL (ref 4.5–12.0)
TSH: 1.3 u[IU]/mL (ref 0.450–4.500)
Total Protein: 6.6 g/dL (ref 6.0–8.5)
Triglycerides: 73 mg/dL (ref 0–149)
Uric Acid: 5.1 mg/dL (ref 3.8–8.4)
VLDL Cholesterol Cal: 14 mg/dL (ref 5–40)
WBC: 5.5 10*3/uL (ref 3.4–10.8)
eGFR: 97 mL/min/{1.73_m2} (ref >59)

## 2021-06-30 LAB — RPR QUALITATIVE: RPR Ser Ql: NONREACTIVE

## 2021-06-30 LAB — HGB A1C W/O EAG: Hgb A1c MFr Bld: 5.7 % — ABNORMAL HIGH (ref 4.8–5.6)

## 2021-06-30 LAB — HCV INTERPRETATION

## 2021-06-30 LAB — HIV ANTIBODY (ROUTINE TESTING W REFLEX): HIV Screen 4th Generation wRfx: NONREACTIVE

## 2021-06-30 LAB — VITAMIN D 25 HYDROXY (VIT D DEFICIENCY, FRACTURES): Vit D, 25-Hydroxy: 55.8 ng/mL (ref 30.0–100.0)

## 2021-06-30 LAB — HCV AB W REFLEX TO QUANT PCR: HCV Ab: <0.1 s/co ratio (ref 0.0–0.9)

## 2021-07-18 DIAGNOSIS — F330 Major depressive disorder, recurrent, mild: Secondary | ICD-10-CM | POA: Diagnosis not present

## 2021-08-01 DIAGNOSIS — F330 Major depressive disorder, recurrent, mild: Secondary | ICD-10-CM | POA: Diagnosis not present

## 2021-08-15 DIAGNOSIS — F410 Panic disorder [episodic paroxysmal anxiety] without agoraphobia: Secondary | ICD-10-CM | POA: Diagnosis not present

## 2021-08-15 DIAGNOSIS — F411 Generalized anxiety disorder: Secondary | ICD-10-CM | POA: Diagnosis not present

## 2021-08-15 DIAGNOSIS — F5101 Primary insomnia: Secondary | ICD-10-CM | POA: Diagnosis not present

## 2021-08-19 DIAGNOSIS — F330 Major depressive disorder, recurrent, mild: Secondary | ICD-10-CM | POA: Diagnosis not present

## 2021-08-29 DIAGNOSIS — F330 Major depressive disorder, recurrent, mild: Secondary | ICD-10-CM | POA: Diagnosis not present

## 2021-09-13 DIAGNOSIS — Z79899 Other long term (current) drug therapy: Secondary | ICD-10-CM | POA: Diagnosis not present

## 2021-09-13 DIAGNOSIS — F902 Attention-deficit hyperactivity disorder, combined type: Secondary | ICD-10-CM | POA: Diagnosis not present

## 2021-09-13 DIAGNOSIS — F419 Anxiety disorder, unspecified: Secondary | ICD-10-CM | POA: Diagnosis not present

## 2021-09-13 DIAGNOSIS — R41840 Attention and concentration deficit: Secondary | ICD-10-CM | POA: Diagnosis not present

## 2021-09-16 DIAGNOSIS — R41840 Attention and concentration deficit: Secondary | ICD-10-CM | POA: Diagnosis not present

## 2021-09-26 DIAGNOSIS — F330 Major depressive disorder, recurrent, mild: Secondary | ICD-10-CM | POA: Diagnosis not present

## 2021-09-27 ENCOUNTER — Other Ambulatory Visit: Payer: Self-pay

## 2021-09-27 ENCOUNTER — Other Ambulatory Visit: Payer: BC Managed Care – PPO

## 2021-09-27 DIAGNOSIS — R7309 Other abnormal glucose: Secondary | ICD-10-CM

## 2021-09-27 DIAGNOSIS — Z113 Encounter for screening for infections with a predominantly sexual mode of transmission: Secondary | ICD-10-CM

## 2021-09-28 ENCOUNTER — Encounter: Payer: Self-pay | Admitting: Nurse Practitioner

## 2021-09-30 ENCOUNTER — Other Ambulatory Visit: Payer: Self-pay | Admitting: Nurse Practitioner

## 2021-09-30 DIAGNOSIS — Z8619 Personal history of other infectious and parasitic diseases: Secondary | ICD-10-CM

## 2021-09-30 LAB — COMPREHENSIVE METABOLIC PANEL
ALT: 25 IU/L (ref 0–44)
AST: 22 IU/L (ref 0–40)
Albumin/Globulin Ratio: 2.1 (ref 1.2–2.2)
Albumin: 4.9 g/dL (ref 4.0–5.0)
Alkaline Phosphatase: 47 IU/L (ref 44–121)
BUN/Creatinine Ratio: 16 (ref 9–20)
BUN: 18 mg/dL (ref 6–20)
Bilirubin Total: 0.5 mg/dL (ref 0.0–1.2)
CO2: 26 mmol/L (ref 20–29)
Calcium: 9.5 mg/dL (ref 8.7–10.2)
Chloride: 101 mmol/L (ref 96–106)
Creatinine, Ser: 1.1 mg/dL (ref 0.76–1.27)
Globulin, Total: 2.3 g/dL (ref 1.5–4.5)
Glucose: 103 mg/dL — ABNORMAL HIGH (ref 70–99)
Potassium: 4.8 mmol/L (ref 3.5–5.2)
Sodium: 141 mmol/L (ref 134–144)
Total Protein: 7.2 g/dL (ref 6.0–8.5)
eGFR: 89 mL/min/{1.73_m2} (ref >59)

## 2021-09-30 LAB — HIV ANTIBODY (ROUTINE TESTING W REFLEX): HIV Screen 4th Generation wRfx: NONREACTIVE

## 2021-09-30 LAB — HEMOGLOBIN A1C
Est. average glucose Bld gHb Est-mCnc: 108 mg/dL
Hgb A1c MFr Bld: 5.4 % (ref 4.8–5.6)

## 2021-09-30 MED ORDER — VALACYCLOVIR HCL 500 MG PO TABS: 500.0000 mg | ORAL_TABLET | Freq: Every day | ORAL | 11 refills | Status: DC

## 2021-09-30 NOTE — Progress Notes (Signed)
Refill request

## 2021-10-03 ENCOUNTER — Encounter: Payer: Self-pay | Admitting: Nurse Practitioner

## 2021-10-03 DIAGNOSIS — Z8619 Personal history of other infectious and parasitic diseases: Secondary | ICD-10-CM

## 2021-10-04 MED ORDER — VALACYCLOVIR HCL 500 MG PO TABS: 500.0000 mg | ORAL_TABLET | Freq: Every day | ORAL | 11 refills | Status: AC

## 2021-10-05 ENCOUNTER — Encounter: Payer: Self-pay | Admitting: Nurse Practitioner

## 2021-10-24 DIAGNOSIS — F330 Major depressive disorder, recurrent, mild: Secondary | ICD-10-CM | POA: Diagnosis not present

## 2021-10-30 ENCOUNTER — Encounter: Payer: Self-pay | Admitting: Nurse Practitioner

## 2021-11-03 ENCOUNTER — Other Ambulatory Visit: Payer: BC Managed Care – PPO

## 2021-11-03 DIAGNOSIS — Z113 Encounter for screening for infections with a predominantly sexual mode of transmission: Secondary | ICD-10-CM

## 2021-11-04 ENCOUNTER — Encounter: Payer: Self-pay | Admitting: Nurse Practitioner

## 2021-11-04 ENCOUNTER — Other Ambulatory Visit: Payer: Self-pay | Admitting: Nurse Practitioner

## 2021-11-04 DIAGNOSIS — Z113 Encounter for screening for infections with a predominantly sexual mode of transmission: Secondary | ICD-10-CM

## 2021-11-04 DIAGNOSIS — Z209 Contact with and (suspected) exposure to unspecified communicable disease: Secondary | ICD-10-CM

## 2021-11-04 DIAGNOSIS — Z7251 High risk heterosexual behavior: Secondary | ICD-10-CM

## 2021-11-04 LAB — COMPREHENSIVE METABOLIC PANEL
ALT: 23 IU/L (ref 0–44)
AST: 22 IU/L (ref 0–40)
Albumin/Globulin Ratio: 2 (ref 1.2–2.2)
Albumin: 4.9 g/dL (ref 4.0–5.0)
Alkaline Phosphatase: 61 IU/L (ref 44–121)
BUN/Creatinine Ratio: 16 (ref 9–20)
BUN: 18 mg/dL (ref 6–20)
Bilirubin Total: 0.7 mg/dL (ref 0.0–1.2)
CO2: 27 mmol/L (ref 20–29)
Calcium: 9 mg/dL (ref 8.7–10.2)
Chloride: 96 mmol/L (ref 96–106)
Creatinine, Ser: 1.12 mg/dL (ref 0.76–1.27)
Globulin, Total: 2.5 g/dL (ref 1.5–4.5)
Glucose: 123 mg/dL — ABNORMAL HIGH (ref 70–99)
Potassium: 4.5 mmol/L (ref 3.5–5.2)
Sodium: 136 mmol/L (ref 134–144)
Total Protein: 7.4 g/dL (ref 6.0–8.5)
eGFR: 87 mL/min/{1.73_m2} (ref >59)

## 2021-11-04 LAB — HIV ANTIBODY (ROUTINE TESTING W REFLEX): HIV Screen 4th Generation wRfx: NONREACTIVE

## 2021-11-04 MED ORDER — DESCOVY 200-25 MG PO TABS: 1.0000 | ORAL_TABLET | Freq: Every day | ORAL | 2 refills | Status: DC

## 2021-11-04 NOTE — Addendum Note (Signed)
Addended by: Apolonio Schneiders E on: 11/04/2021 01:49 PM ? ? Modules accepted: Orders ? ?

## 2021-11-04 NOTE — Progress Notes (Signed)
Provided  ? ?Meds ordered this encounter  ?Medications  ? emtricitabine-tenofovir AF (DESCOVY) 200-25 MG tablet  ?  Sig: Take 1 tablet by mouth daily.  ?  Dispense:  30 tablet  ?  Refill:  2  ?  ? ?At request of patient for prophylaxis/prevention. Patient has tolerated well in the past  ? ?Recent Results (from the past 2160 hour(s))  ?HgB A1c     Status: None  ? Collection Time: 09/27/21  8:07 AM  ?Result Value Ref Range  ? Hgb A1c MFr Bld 5.4 4.8 - 5.6 %  ?  Comment:          Prediabetes: 5.7 - 6.4 ?         Diabetes: >6.4 ?         Glycemic control for adults with diabetes: <7.0 ?  ? Est. average glucose Bld gHb Est-mCnc 108 mg/dL  ?Comprehensive metabolic panel     Status: Abnormal  ? Collection Time: 09/27/21  8:07 AM  ?Result Value Ref Range  ? Glucose 103 (H) 70 - 99 mg/dL  ? BUN 18 6 - 20 mg/dL  ? Creatinine, Ser 1.10 0.76 - 1.27 mg/dL  ? eGFR 89 >59 mL/min/1.73  ? BUN/Creatinine Ratio 16 9 - 20  ? Sodium 141 134 - 144 mmol/L  ? Potassium 4.8 3.5 - 5.2 mmol/L  ? Chloride 101 96 - 106 mmol/L  ? CO2 26 20 - 29 mmol/L  ? Calcium 9.5 8.7 - 10.2 mg/dL  ? Total Protein 7.2 6.0 - 8.5 g/dL  ? Albumin 4.9 4.0 - 5.0 g/dL  ? Globulin, Total 2.3 1.5 - 4.5 g/dL  ? Albumin/Globulin Ratio 2.1 1.2 - 2.2  ? Bilirubin Total 0.5 0.0 - 1.2 mg/dL  ? Alkaline Phosphatase 47 44 - 121 IU/L  ? AST 22 0 - 40 IU/L  ? ALT 25 0 - 44 IU/L  ?HIV Antibody (routine testing w rflx)     Status: None  ? Collection Time: 09/27/21  8:07 AM  ?Result Value Ref Range  ? HIV Screen 4th Generation wRfx Non Reactive Non Reactive  ?  Comment: HIV Negative ?HIV-1/HIV-2 antibodies and HIV-1 p24 antigen were NOT detected. ?There is no laboratory evidence of HIV infection. ?  ?Comprehensive metabolic panel     Status: None (Preliminary result)  ? Collection Time: 11/03/21  8:19 AM  ?Result Value Ref Range  ? Glucose WILL FOLLOW   ? BUN WILL FOLLOW   ? Creatinine, Ser WILL FOLLOW   ? eGFR WILL FOLLOW   ? BUN/Creatinine Ratio WILL FOLLOW   ? Sodium WILL  FOLLOW   ? Potassium WILL FOLLOW   ? Chloride WILL FOLLOW   ? CO2 WILL FOLLOW   ? Calcium WILL FOLLOW   ? Total Protein WILL FOLLOW   ? Albumin WILL FOLLOW   ? Globulin, Total WILL FOLLOW   ? Albumin/Globulin Ratio WILL FOLLOW   ? Bilirubin Total WILL FOLLOW   ? Alkaline Phosphatase WILL FOLLOW   ? AST WILL FOLLOW   ? ALT WILL FOLLOW   ?HIV Antibody (routine testing w rflx)     Status: None  ? Collection Time: 11/03/21  8:19 AM  ?Result Value Ref Range  ? HIV Screen 4th Generation wRfx Non Reactive Non Reactive  ?  Comment: HIV Negative ?HIV-1/HIV-2 antibodies and HIV-1 p24 antigen were NOT detected. ?There is no laboratory evidence of HIV infection. ?  ?  ? ?RTC in 3 months for testing earlier with  any SEs ?

## 2021-11-07 DIAGNOSIS — F330 Major depressive disorder, recurrent, mild: Secondary | ICD-10-CM | POA: Diagnosis not present

## 2021-11-07 LAB — CHLAMYDIA/GC NAA, CONFIRMATION
Chlamydia trachomatis, NAA: NEGATIVE
Chlamydia trachomatis, NAA: NEGATIVE
Neisseria gonorrhoeae, NAA: NEGATIVE
Neisseria gonorrhoeae, NAA: NEGATIVE

## 2021-11-08 ENCOUNTER — Encounter: Payer: Self-pay | Admitting: Nurse Practitioner

## 2021-11-18 DIAGNOSIS — F902 Attention-deficit hyperactivity disorder, combined type: Secondary | ICD-10-CM | POA: Diagnosis not present

## 2021-11-18 DIAGNOSIS — Z79899 Other long term (current) drug therapy: Secondary | ICD-10-CM | POA: Diagnosis not present

## 2021-11-18 DIAGNOSIS — F419 Anxiety disorder, unspecified: Secondary | ICD-10-CM | POA: Diagnosis not present

## 2021-11-21 DIAGNOSIS — F330 Major depressive disorder, recurrent, mild: Secondary | ICD-10-CM | POA: Diagnosis not present

## 2021-12-08 ENCOUNTER — Encounter: Payer: Self-pay | Admitting: Adult Health

## 2021-12-08 ENCOUNTER — Ambulatory Visit: Payer: BC Managed Care – PPO | Admitting: Adult Health

## 2021-12-08 VITALS — BP 112/80 | HR 85 | Temp 97.3°F | Resp 16

## 2021-12-08 DIAGNOSIS — Z202 Contact with and (suspected) exposure to infections with a predominantly sexual mode of transmission: Secondary | ICD-10-CM

## 2021-12-08 MED ORDER — DOXYCYCLINE HYCLATE 100 MG PO TABS: 100.0000 mg | ORAL_TABLET | Freq: Two times a day (BID) | ORAL | 0 refills | Status: DC

## 2021-12-08 NOTE — Progress Notes (Signed)
[facility] [address], [place] [address]   Office Visit Note  Patient Name: Zachary Becker  [id number]  [id number]  Date of Service: 12/08/2021  Chief Complaint  Patient presents with   Exposure to STD    Patient thinks he may have been exposed to syphilis.     Exposure to STD  Pt is here for acute visit.  He reports his current sex partner was told by a previous sex partner that they have Syphilis. His partner has been tested, and is getting treatment while awaiting results.  Mr. Mercado was routine tested for STI's a few months ago when restarting prep.  He has been diligent with taking his prep, and uses condoms with sexual activity.  He and his partner are evaluating the possibility of stopping condom use due to their growing monogamy.  He would like to be tested again before they move forward with this.     Current Medication:  Outpatient Encounter Medications as of 12/08/2021  Medication Sig   DAYVIGO 10 MG TABS    doxycycline (VIBRA-TABS) 100 MG tablet Take 1 tablet (100 mg total) by mouth 2 (two) times daily.   emtricitabine-tenofovir AF (DESCOVY) 200-25 MG tablet Take 1 tablet by mouth daily.   gabapentin (NEURONTIN) 600 MG tablet 1 tablet   valACYclovir (VALTREX) 500 MG tablet Take 1 tablet (500 mg total) by mouth daily.   VYVANSE 40 MG capsule Take 40 mg by mouth daily.   tadalafil (CIALIS) 5 MG tablet    valACYclovir (VALTREX) 1000 MG tablet Take 1 tablet (1,000 mg total) by mouth 3 (three) times daily.   [DISCONTINUED] amphetamine-dextroamphetamine (ADDERALL) 10 MG tablet Take 10 mg by mouth 2 (two) times daily.   [DISCONTINUED] buPROPion (WELLBUTRIN XL) 300 MG 24 hr tablet 1 tablet in the morning   [DISCONTINUED] doxepin (SINEQUAN) 10 MG capsule Take by mouth.   [DISCONTINUED] gabapentin (NEURONTIN) 600 MG tablet    [DISCONTINUED] Vilazodone HCl (VIIBRYD STARTER PACK) 10 & 20 MG KIT    No facility-administered encounter medications on file as  of 12/08/2021.      Medical History: Past Medical History:  Diagnosis Date   Anxiety    Depression      Vital Signs: BP 112/80 (BP Location: Left Arm, Patient Position: Sitting, Cuff Size: Normal)   Pulse 85   Temp (!) 97.3 F (36.3 C) (Tympanic)   Resp 16   SpO2 98%    Review of Systems  All other systems reviewed and are negative.  Physical Exam Vitals and nursing note reviewed.    Assessment/Plan: 1. Exposure to sexually transmitted disease (STD) Retesting patient per his request.  Encouraged continued PrEP use in his relationship whether they decide to use condoms going forward or not.  Will treat with 2 weeks of Doxycycline per CDC guidelines for Syphilis exposure. If current sex partner has positive test, would consider an additional 2 weeks.  Patient should be retested in 3 months per his PrEP guidelines.  - HIV antibody (with reflex) - RPR - Chlamydia/GC NAA, Confirmation - doxycycline (VIBRA-TABS) 100 MG tablet; Take 1 tablet (100 mg total) by mouth 2 (two) times daily.  Dispense: 28 tablet; Refill: 0     General Counseling: Dontavion verbalizes understanding of the findings of todays visit and agrees with plan of treatment. I have discussed any further diagnostic evaluation that may be needed or ordered today. We also reviewed his medications today. he has been encouraged to call the  office with any questions or concerns that should arise related to todays visit.   Orders Placed This Encounter  Procedures   Chlamydia/GC NAA, Confirmation   HIV antibody (with reflex)   RPR    Meds ordered this encounter  Medications   doxycycline (VIBRA-TABS) 100 MG tablet    Sig: Take 1 tablet (100 mg total) by mouth 2 (two) times daily.    Dispense:  28 tablet    Refill:  0    Time spent:30 Minutes    Kendell Bane AGNP-C Nurse Practitioner

## 2021-12-09 ENCOUNTER — Telehealth: Payer: Self-pay | Admitting: Adult Health

## 2021-12-09 ENCOUNTER — Ambulatory Visit: Payer: BC Managed Care – PPO | Admitting: Internal Medicine

## 2021-12-09 ENCOUNTER — Encounter: Payer: Self-pay | Admitting: Internal Medicine

## 2021-12-09 DIAGNOSIS — F330 Major depressive disorder, recurrent, mild: Secondary | ICD-10-CM | POA: Insufficient documentation

## 2021-12-09 DIAGNOSIS — A539 Syphilis, unspecified: Secondary | ICD-10-CM | POA: Diagnosis not present

## 2021-12-09 DIAGNOSIS — F419 Anxiety disorder, unspecified: Secondary | ICD-10-CM | POA: Insufficient documentation

## 2021-12-09 LAB — HIV ANTIBODY (ROUTINE TESTING W REFLEX): HIV Screen 4th Generation wRfx: NONREACTIVE

## 2021-12-09 LAB — RPR: RPR Ser Ql: NONREACTIVE

## 2021-12-09 MED ORDER — PENICILLIN G BENZATHINE 1200000 UNIT/2ML IM SUSY
2.4000 10*6.[IU] | PREFILLED_SYRINGE | Freq: Once | INTRAMUSCULAR | Status: AC
  Administered 2021-12-09: 2.4 10*6.[IU] via INTRAMUSCULAR

## 2021-12-09 NOTE — Patient Instructions (Signed)
You had the Bicillin L-A shot today (total 2.4 million units)  Please continue all other medications as before, except ok to hold on the 2 wks of antibiotics as you mentioned  Please have the pharmacy call with any other refills you may need.  Please keep your appointments with your specialists as you may have planned

## 2021-12-09 NOTE — Progress Notes (Signed)
Patient ID: Zachary Becker, male   DOB: 12-10-1985, 36 y.o.   MRN: [id number]        Chief Complaint: follow up high risk syphylis exposure       HPI:  Zachary Becker is a 36 y.o. male here after being informed the partner of his partner was found + for syphilis and treated; pt himself also found + syphilis and rx a 2 wk regimen of oral med but is asking for the IM Bicillin instead, which we do have in stock today.  No rash, fever, and Denies worsening reflux, abd pain, dysphagia, n/v, bowel change or blood.  Declines other testing or treatment at this time.        Wt Readings from Last 3 Encounters:  12/09/21 179 lb 6.4 oz (81.4 kg)  02/11/21 192 lb (87.1 kg)  01/21/21 191 lb 12.8 oz (87 kg)   BP Readings from Last 3 Encounters:  12/09/21 120/80  12/08/21 112/80  02/11/21 116/82         Past Medical History:  Diagnosis Date   Anxiety    Depression    History reviewed. No pertinent surgical history.  reports that he quit smoking about 3 years ago. His smoking use included cigarettes. He has never used smokeless tobacco. He reports that he does not drink alcohol and does not use drugs. family history is not on file. No Known Allergies Current Outpatient Medications on File Prior to Visit  Medication Sig Dispense Refill   busPIRone (BUSPAR) 10 MG tablet      DAYVIGO 10 MG TABS      emtricitabine-tenofovir AF (DESCOVY) 200-25 MG tablet Take 1 tablet by mouth daily. 30 tablet 2   gabapentin (NEURONTIN) 600 MG tablet 1 tablet     tadalafil (CIALIS) 5 MG tablet      Turmeric 500 MG CAPS See admin instructions.     valACYclovir (VALTREX) 1000 MG tablet Take 1 tablet (1,000 mg total) by mouth 3 (three) times daily. 15 tablet 1   valACYclovir (VALTREX) 500 MG tablet Take 1 tablet (500 mg total) by mouth daily.  11   VYVANSE 40 MG capsule Take 40 mg by mouth daily.     doxycycline (VIBRA-TABS) 100 MG tablet Take 1 tablet (100 mg total) by mouth 2 (two) times daily. (Patient  not taking: Reported on 12/09/2021) 28 tablet 0   No current facility-administered medications on file prior to visit.        ROS:  All others reviewed and negative.  Objective        PE:  BP 120/80 (BP Location: Right Arm, Patient Position: Sitting, Cuff Size: Large)   Pulse 86   Temp 98.1 F (36.7 C) (Oral)   Ht 6' 3.75" (1.924 m)   Wt 179 lb 6.4 oz (81.4 kg)   SpO2 99%   BMI 21.98 kg/m                 Constitutional: Pt appears in NAD               HENT: Head: NCAT.                Right Ear: External ear normal.                 Left Ear: External ear normal.                Eyes: . Pupils are equal, round, and reactive to light. Conjunctivae and  EOM are normal               Nose: without d/c or deformity               Neck: Neck supple. Gross normal ROM               Cardiovascular: Normal rate and regular rhythm.                 Pulmonary/Chest: Effort normal and breath sounds without rales or wheezing                              Neurological: Pt is alert. At baseline orientation, motor grossly intact               Skin: Skin is warm. No rashes, no other new lesions, LE edema - none               Psychiatric: Pt behavior is normal without agitation   Micro: none  Cardiac tracings I have personally interpreted today:  none  Pertinent Radiological findings (summarize): none   Lab Results  Component Value Date   WBC 5.5 06/29/2021   HGB 13.9 06/29/2021   HCT 40.7 06/29/2021   PLT 213 06/29/2021   GLUCOSE 123 (H) 11/03/2021   CHOL 145 06/29/2021   TRIG 73 06/29/2021   HDL 44 06/29/2021   LDLCALC 87 06/29/2021   ALT 23 11/03/2021   AST 22 11/03/2021   NA 136 11/03/2021   K 4.5 11/03/2021   CL 96 11/03/2021   CREATININE 1.12 11/03/2021   BUN 18 11/03/2021   CO2 27 11/03/2021   TSH 1.300 06/29/2021   INR 1.1 (H) 05/18/2020   HGBA1C 5.4 09/27/2021   Assessment/Plan:  Zachary Becker is a 36 y.o. White or Caucasian [1] male with  has a past medical  history of Anxiety and Depression.  Syphilis (acquired) Pt for bicillin LA dual injection 1.2 mil units (total 2.4 mil units) as per pt request, declines other consideration for testing or tx at this time  Followup: Return if symptoms worsen or fail to improve.  Cathlean Cower, MD 12/11/2021 4:35 PM [facility]

## 2021-12-11 ENCOUNTER — Encounter: Payer: Self-pay | Admitting: Internal Medicine

## 2021-12-11 NOTE — Assessment & Plan Note (Signed)
Pt for bicillin LA dual injection 1.2 mil units (total 2.4 mil units) as per pt request, declines other consideration for testing or tx at this time

## 2021-12-13 LAB — CHLAMYDIA/GC NAA, CONFIRMATION
Chlamydia trachomatis, NAA: NEGATIVE
Neisseria gonorrhoeae, NAA: NEGATIVE

## 2021-12-14 NOTE — Telephone Encounter (Signed)
Opened by mistake.

## 2021-12-19 DIAGNOSIS — F330 Major depressive disorder, recurrent, mild: Secondary | ICD-10-CM | POA: Diagnosis not present

## 2021-12-29 ENCOUNTER — Ambulatory Visit: Payer: BC Managed Care – PPO | Admitting: Adult Health

## 2021-12-29 ENCOUNTER — Encounter: Payer: Self-pay | Admitting: Adult Health

## 2021-12-29 VITALS — HR 86 | Temp 98.0°F

## 2021-12-29 DIAGNOSIS — Z708 Other sex counseling: Secondary | ICD-10-CM

## 2021-12-29 NOTE — Progress Notes (Signed)
[facility] [address], [place] [address]   Office Visit Note  Patient Name: Zachary Becker Date of Birth [birth year]  Medical Record number [id number]  Date of Service: 12/29/2021  Chief Complaint  Patient presents with   Exposure to STD     Exposure to STD The patient's pertinent negatives include no penile discharge or testicular pain. Pertinent negatives include no abdominal pain, dysuria, fever or flank pain.   Pt is here for follow up. He was treated on 12/08/2021 for a syphilis exposure.  His partners syphilis test just came back yesterday as positive.  The partner was treated back around the same time.  The patient tested negative at last testing, and was treated due to the exposure. He continues to deny symptoms.        Current Medication:  Outpatient Encounter Medications as of 12/29/2021  Medication Sig   busPIRone (BUSPAR) 10 MG tablet    DAYVIGO 10 MG TABS    emtricitabine-tenofovir AF (DESCOVY) 200-25 MG tablet Take 1 tablet by mouth daily.   gabapentin (NEURONTIN) 600 MG tablet 1 tablet   Turmeric 500 MG CAPS See admin instructions.   valACYclovir (VALTREX) 1000 MG tablet Take 1 tablet (1,000 mg total) by mouth 3 (three) times daily.   valACYclovir (VALTREX) 500 MG tablet Take 1 tablet (500 mg total) by mouth daily.   VYVANSE 40 MG capsule Take 40 mg by mouth daily.   doxycycline (VIBRA-TABS) 100 MG tablet Take 1 tablet (100 mg total) by mouth 2 (two) times daily. (Patient not taking: Reported on 12/09/2021)   tadalafil (CIALIS) 5 MG tablet  (Patient not taking: Reported on 12/29/2021)   No facility-administered encounter medications on file as of 12/29/2021.      Medical History: Past Medical History:  Diagnosis Date   Anxiety    Depression      Vital Signs: Pulse 86   Temp 98 F (36.7 C) (Tympanic)   SpO2 98%    Review of Systems  Constitutional:  Negative for fatigue and fever.  Gastrointestinal:  Negative for abdominal  pain.  Genitourinary:  Negative for dysuria, flank pain, penile discharge, penile swelling and testicular pain.  All other systems reviewed and are negative.   Physical Exam Vitals and nursing note reviewed.  Constitutional:      Appearance: Normal appearance.  Neurological:     Mental Status: He is alert.    Assessment/Plan: 1. Encounter for sexually transmitted disease counseling Patient has been treated, and does not have symptoms currently.  Discussed retesting at normal time (for his prep).  Sooner if symptoms develop.      General Counseling: Tiernan verbalizes understanding of the findings of todays visit and agrees with plan of treatment. I have discussed any further diagnostic evaluation that may be needed or ordered today. We also reviewed his medications today. he has been encouraged to call the office with any questions or concerns that should arise related to todays visit.   No orders of the defined types were placed in this encounter.   No orders of the defined types were placed in this encounter.   Time spent:30 Minutes    Kendell Bane AGNP-C Nurse Practitioner

## 2022-01-02 DIAGNOSIS — F330 Major depressive disorder, recurrent, mild: Secondary | ICD-10-CM | POA: Diagnosis not present

## 2022-01-16 DIAGNOSIS — F330 Major depressive disorder, recurrent, mild: Secondary | ICD-10-CM | POA: Diagnosis not present

## 2022-01-23 DIAGNOSIS — F411 Generalized anxiety disorder: Secondary | ICD-10-CM | POA: Diagnosis not present

## 2022-01-23 DIAGNOSIS — F902 Attention-deficit hyperactivity disorder, combined type: Secondary | ICD-10-CM | POA: Diagnosis not present

## 2022-01-23 DIAGNOSIS — G4700 Insomnia, unspecified: Secondary | ICD-10-CM | POA: Diagnosis not present

## 2022-01-25 DIAGNOSIS — D225 Melanocytic nevi of trunk: Secondary | ICD-10-CM | POA: Diagnosis not present

## 2022-01-25 DIAGNOSIS — L821 Other seborrheic keratosis: Secondary | ICD-10-CM | POA: Diagnosis not present

## 2022-01-25 DIAGNOSIS — L814 Other melanin hyperpigmentation: Secondary | ICD-10-CM | POA: Diagnosis not present

## 2022-01-27 DIAGNOSIS — F330 Major depressive disorder, recurrent, mild: Secondary | ICD-10-CM | POA: Diagnosis not present

## 2022-02-01 DIAGNOSIS — F330 Major depressive disorder, recurrent, mild: Secondary | ICD-10-CM | POA: Diagnosis not present

## 2022-02-01 DIAGNOSIS — F603 Borderline personality disorder: Secondary | ICD-10-CM | POA: Diagnosis not present

## 2022-02-05 ENCOUNTER — Other Ambulatory Visit: Payer: Self-pay | Admitting: Nurse Practitioner

## 2022-02-05 DIAGNOSIS — Z209 Contact with and (suspected) exposure to unspecified communicable disease: Secondary | ICD-10-CM

## 2022-02-10 DIAGNOSIS — F603 Borderline personality disorder: Secondary | ICD-10-CM | POA: Diagnosis not present

## 2022-02-10 DIAGNOSIS — F330 Major depressive disorder, recurrent, mild: Secondary | ICD-10-CM | POA: Diagnosis not present

## 2022-02-27 DIAGNOSIS — F603 Borderline personality disorder: Secondary | ICD-10-CM | POA: Diagnosis not present

## 2022-02-27 DIAGNOSIS — F330 Major depressive disorder, recurrent, mild: Secondary | ICD-10-CM | POA: Diagnosis not present

## 2022-03-06 ENCOUNTER — Encounter: Payer: Self-pay | Admitting: Family Medicine

## 2022-03-06 ENCOUNTER — Ambulatory Visit: Payer: BC Managed Care – PPO | Admitting: Family Medicine

## 2022-03-06 VITALS — BP 110/64 | HR 80 | Temp 97.9°F | Wt 176.0 lb

## 2022-03-06 DIAGNOSIS — H1032 Unspecified acute conjunctivitis, left eye: Secondary | ICD-10-CM | POA: Diagnosis not present

## 2022-03-06 DIAGNOSIS — J3489 Other specified disorders of nose and nasal sinuses: Secondary | ICD-10-CM | POA: Diagnosis not present

## 2022-03-06 LAB — POC COVID19 BINAXNOW: SARS Coronavirus 2 Ag: NEGATIVE

## 2022-03-06 MED ORDER — POLYMYXIN B-TRIMETHOPRIM 10000-0.1 UNIT/ML-% OP SOLN: 1.0000 [drp] | OPHTHALMIC | 0 refills | Status: DC

## 2022-03-06 NOTE — Patient Instructions (Signed)
Viral Conjunctivitis, Adult  Viral conjunctivitis is an inflammation of the conjunctiva. The conjunctiva is the clear membrane that covers the white part of the eye and the inner surface of the eyelid. The inflammation is caused by a viral infection. The blood vessels in the conjunctiva become large, causing the eye to become red or pink and often itchy and tearing. The inflammation usually starts in one eye and goes to the other in a day or two. Infections usually go away over 1-2 weeks. Viral conjunctivitis is contagious. This means it can be easily passed from one person to another. This condition is often called pink eye. What are the causes? This condition is caused by a virus. It can be spread by touching objects that have been contaminated with the virus, such as doorknobs or towels, and then touching your eye. It can also be passed through tiny droplets, such as from coughing or sneezing. What increases the risk? You are more likely to develop this condition if you have a cold or the flu, or are in close contact with a person who has pink eye. What are the signs or symptoms? Symptoms of this condition include: Redness in the eye. Tearing or watery eyes. Itchy and irritated eyes. Burning feeling in the eyes. Clear drainage from the eye. Swollen eyelids. A gritty feeling in the eye. Light sensitivity. This condition often occurs with other symptoms, such as nasal congestion, cough, and fever. How is this diagnosed? This condition is diagnosed with a medical history and physical exam. If you have discharge from your eye, the discharge may be tested for a virus or to rule out other causes of conjunctivitis. How is this treated? Viral conjunctivitis does not respond to medicines that kill bacteria (antibiotics). The condition most often goes away on its own in 1-2 weeks. If treatment is needed, it is aimed at relieving your symptoms and preventing the spread of infection. This may be done  with artificial tear drops, antihistamine drops, or other eye medicines. In rare cases, steroid eye drops or anti-herpes virus medicines may be prescribed. Follow these instructions at home: Medicines  Take or apply over-the-counter and prescription medicines only as told by your health care provider. Do not touch the edge of the eyelid with the eye-drop bottle or ointment tube when applying medicines to the affected eye. This will prevent the spread of the infection to the other eye or to other people. Eye care Avoid touching or rubbing your eyes. Apply a clean, cool, wet washcloth onto your eye for 10-20 minutes, 3-4 times per day, or as told by your health care provider. If you wear contact lenses, do notwear them until the inflammation is gone and your health care provider says it is safe to wear them again. Ask your health care provider how to disinfect or replace your contact lenses before using them again. Wear glasses until you can resume wearing contacts. Avoid wearing eye makeup until the inflammation is gone. Throw away any old eye cosmetics that may be contaminated. Gently wipe away any crusting from your eye with a wet washcloth or a cotton ball. General instructions Change or wash your pillowcase every day or as told by your health care provider. Do not share towels, pillowcases, washcloths, eye makeup, makeup brushes, eye drops, contact lenses, or eyeglasses. This may spread the infection. Wash your hands often with soap and water. Use paper towels to dry your hands. If soap and water are not available, use hand sanitizer. Avoid contact  with other people until your eye is no longer red and tearing, or as told by your health care provider. Keep all follow-up visits. Contact a health care provider if: Your symptoms do not improve with treatment, or they get worse. You have increased pain. Your vision becomes blurry. You have a fever. You have facial pain, redness, or  swelling. You have yellow or green drainage coming from your eye. You have new symptoms. Get help right away if: You develop severe pain. Your vision gets much worse. Summary Viral conjunctivitis is an inflammation of the conjunctiva. It usually goes away in 1-2 weeks. The condition is caused by a virus and is spread by touching contaminated objects or breathing in droplets from a cough or a sneeze. This condition is usually treated with medicines and cold compresses to relieve the symptoms. Because it is caused by a virus, it should not be treated with antibiotics. This condition is very contagious. To prevent infection, avoid close contact with others, wash your hands often, and do not share towels or washcloths. Contact a health care provider if your symptoms do not go away with treatment, or if you have blurry vision, facial swelling, or increased pain. This information is not intended to replace advice given to you by your health care provider. Make sure you discuss any questions you have with your health care provider. Document Revised: 08/03/2021 Document Reviewed: 08/03/2021 Elsevier Patient Education  2023 Elsevier Inc.  

## 2022-03-06 NOTE — Progress Notes (Signed)
Subjective:  Zachary Becker is a 36 y.o. male who presents for left eye redness, clear drainage, and a stinging sensation post contact lens removal last night. Denies purulent discharge or matting. No blurred or double vision or eye pain with movement.  Reports some mild fatigue.   Using new cream in his eyes and maybe used too much per patient. Also had used a facial powder   No fever, chills, headache, ear pain, sore throat, cough, abdominal pain, N/V/D.     No other aggravating or relieving factors.  No other c/o.  ROS as in subjective.   Objective: Vitals:   03/06/22 1626  BP: 110/64  Pulse: 80  Temp: 97.9 F (36.6 C)  SpO2: 98%    General appearance: Alert, WD/WN, no distress, well appearing                             Skin: warm, no rash                           Head: no sinus tenderness                            Eyes: Left conjunctiva injected, right conjunctiva normal, corneas clear, PERRLA, EOMs intact                            Ears: pearly TMs, external ear canals normal                          Nose: septum midline, clear discharge             Mouth/throat: MMM, tongue normal, mild pharyngeal erythema                           Neck: supple, no adenopathy, no thyromegaly, nontender                          Heart: RRR                         Lungs: CTA bilaterally, no wheezes, rales, or rhonchi      Assessment: Acute conjunctivitis of left eye, unspecified acute conjunctivitis type - Plan: trimethoprim-polymyxin b (POLYTRIM) ophthalmic solution  Rhinorrhea - Plan: POC COVID-19 BinaxNow   Plan: Negative Covid test.  Discussed diagnosis and treatment of conjunctivitis. Polytrim prescribed for left eye.   Suggested symptomatic OTC remedies. Discussed contagious nature of condition.   Tylenol or Ibuprofen OTC for fever and malaise.  Call/return in 2-3 days if symptoms aren't resolving.

## 2022-03-09 DIAGNOSIS — F902 Attention-deficit hyperactivity disorder, combined type: Secondary | ICD-10-CM | POA: Diagnosis not present

## 2022-03-09 DIAGNOSIS — G4700 Insomnia, unspecified: Secondary | ICD-10-CM | POA: Diagnosis not present

## 2022-03-09 DIAGNOSIS — F411 Generalized anxiety disorder: Secondary | ICD-10-CM | POA: Diagnosis not present

## 2022-03-13 DIAGNOSIS — F330 Major depressive disorder, recurrent, mild: Secondary | ICD-10-CM | POA: Diagnosis not present

## 2022-03-13 DIAGNOSIS — F603 Borderline personality disorder: Secondary | ICD-10-CM | POA: Diagnosis not present

## 2022-03-20 ENCOUNTER — Ambulatory Visit (INDEPENDENT_AMBULATORY_CARE_PROVIDER_SITE_OTHER): Payer: Self-pay | Admitting: Physician Assistant

## 2022-03-20 ENCOUNTER — Encounter: Payer: Self-pay | Admitting: Physician Assistant

## 2022-03-20 VITALS — BP 120/80 | HR 81 | Temp 98.1°F

## 2022-03-20 DIAGNOSIS — J069 Acute upper respiratory infection, unspecified: Secondary | ICD-10-CM

## 2022-03-20 LAB — POC COVID19 BINAXNOW: SARS Coronavirus 2 Ag: NEGATIVE

## 2022-03-20 NOTE — Progress Notes (Signed)
[facility] [address], [place] [address]   Office Visit Note  Patient Name: Zachary Becker Date of Birth [birth year]  Medical Record number [id number]  Date of Service: 03/20/2022  Chief Complaint  Patient presents with   Cold Exposure    Feverish, drainage, congested, feels off. Was exposed Mon. And started feeling bad over the weekend.     36 y/o M presents to the clinic for c/o "mild flu-like symptoms" along with +direct exposure to covid 1 week ago. His symptoms started 3 days ago with mild sore throat and post nasal drainage. He also felt "hot and couldn't sleep". Denies CP, SOB, or body aches. Tested negative for covid at home, but his symptoms continued over the weekend.       Current Medication:  Outpatient Encounter Medications as of 03/20/2022  Medication Sig   busPIRone (BUSPAR) 10 MG tablet    DAYVIGO 10 MG TABS    DESCOVY 200-25 MG tablet TAKE ONE TABLET BY MOUTH DAILY   doxycycline (VIBRA-TABS) 100 MG tablet Take 1 tablet (100 mg total) by mouth 2 (two) times daily.   gabapentin (NEURONTIN) 600 MG tablet 1 tablet   tadalafil (CIALIS) 5 MG tablet    trimethoprim-polymyxin b (POLYTRIM) ophthalmic solution Place 1 drop into the left eye every 4 (four) hours.   Turmeric 500 MG CAPS See admin instructions.   valACYclovir (VALTREX) 1000 MG tablet Take 1 tablet (1,000 mg total) by mouth 3 (three) times daily.   VYVANSE 40 MG capsule Take 40 mg by mouth daily.   No facility-administered encounter medications on file as of 03/20/2022.      Medical History: Past Medical History:  Diagnosis Date   Anxiety    Depression      Vital Signs: BP 120/80 (BP Location: Left Arm, Patient Position: Sitting, Cuff Size: Normal)   Pulse 81   Temp 98.1 F (36.7 C) (Tympanic)   SpO2 99%    Review of Systems  Constitutional: Negative.   HENT:  Positive for congestion, postnasal drip and sore throat.   Respiratory: Negative.    Cardiovascular:  Negative.     Physical Exam Constitutional:      Appearance: Normal appearance.  HENT:     Head: Atraumatic.     Right Ear: Tympanic membrane, ear canal and external ear normal.     Left Ear: Tympanic membrane, ear canal and external ear normal.     Nose: Nose normal.     Mouth/Throat:     Mouth: Mucous membranes are moist.     Pharynx: Oropharynx is clear.  Eyes:     Extraocular Movements: Extraocular movements intact.  Cardiovascular:     Rate and Rhythm: Normal rate and regular rhythm.  Pulmonary:     Effort: Pulmonary effort is normal.     Breath sounds: Normal breath sounds.  Musculoskeletal:     Cervical back: Neck supple.  Skin:    General: Skin is warm.  Neurological:     Mental Status: He is alert.  Psychiatric:        Mood and Affect: Mood normal.        Behavior: Behavior normal.        Thought Content: Thought content normal.        Judgment: Judgment normal.       Assessment/Plan:   ICD-10-CM   1. Viral upper respiratory tract infection  J06.9 POC COVID-19       General Counseling: Ohn verbalizes  understanding of the findings of todays visit and agrees with plan of treatment. I have discussed any further diagnostic evaluation that may be needed or ordered today. We also reviewed his medications today. he has been encouraged to call the office with any questions or concerns that should arise related to todays visit.   Orders Placed This Encounter  Procedures   POC COVID-19    Reviewed negative covid test result with patient.  Stay well hydrated. Continue to use mask as he is exposed to students on campus. Take otc decongestant as directed on the box. RTC if symptoms don't improve or worsen. Pt verbalized understanding and in agreement.  Today's visit is a 20 minute F2F encounter.   Time spent:20 [place], [place] Physician Assistant

## 2022-03-24 ENCOUNTER — Ambulatory Visit (INDEPENDENT_AMBULATORY_CARE_PROVIDER_SITE_OTHER): Payer: Self-pay | Admitting: Nurse Practitioner

## 2022-03-24 ENCOUNTER — Encounter: Payer: Self-pay | Admitting: Nurse Practitioner

## 2022-03-24 VITALS — Temp 98.1°F

## 2022-03-24 DIAGNOSIS — Z131 Encounter for screening for diabetes mellitus: Secondary | ICD-10-CM

## 2022-03-24 DIAGNOSIS — Z113 Encounter for screening for infections with a predominantly sexual mode of transmission: Secondary | ICD-10-CM

## 2022-03-24 NOTE — Progress Notes (Signed)
[facility] [address], [place] [address] [phone]  Office Visit Note  Patient Name: Zachary Becker Date of Birth [birth year]  Medical Record number [id number]  Date of Service: 03/24/2022  Chief Complaint  Patient presents with   STI testing    Pt would like to be STI tested.     HPI 36 year old male here for routine STI testing, he is currently on Prep- in monogamous relationship.  Testing without symptoms or known exposure   Also recheck A1C was elevated last year  Current Medication:  Outpatient Encounter Medications as of 03/24/2022  Medication Sig   busPIRone (BUSPAR) 10 MG tablet    DAYVIGO 10 MG TABS    DESCOVY 200-25 MG tablet TAKE ONE TABLET BY MOUTH DAILY   doxycycline (VIBRA-TABS) 100 MG tablet Take 1 tablet (100 mg total) by mouth 2 (two) times daily.   gabapentin (NEURONTIN) 600 MG tablet 1 tablet   tadalafil (CIALIS) 5 MG tablet    trimethoprim-polymyxin b (POLYTRIM) ophthalmic solution Place 1 drop into the left eye every 4 (four) hours.   Turmeric 500 MG CAPS See admin instructions.   valACYclovir (VALTREX) 1000 MG tablet Take 1 tablet (1,000 mg total) by mouth 3 (three) times daily.   VYVANSE 40 MG capsule Take 40 mg by mouth daily.   No facility-administered encounter medications on file as of 03/24/2022.      Medical History: Past Medical History:  Diagnosis Date   Anxiety    Depression      Vital Signs: Temp 98.1 F (36.7 C)    Review of Systems  Constitutional: Negative.   HENT: Negative.    Eyes: Negative.   Respiratory: Negative.    Cardiovascular: Negative.   Endocrine: Negative.   Psychiatric/Behavioral: Negative.      Physical Exam HENT:     Head: Normocephalic.  Pulmonary:     Effort: Pulmonary effort is normal.  Neurological:     General: No focal deficit present.     Mental Status: He is alert.  Psychiatric:        Mood and Affect: Mood normal.       Assessment/Plan: 1. Screening  examination for sexually transmitted disease  - HIV Antibody (routine testing w rflx) - RPR Qual - Chlamydia/GC NAA, Confirmation - Chlamydia/GC NAA, Confirmation  2. Screening for diabetes mellitus  - HgB A1c    General Counseling: Jeovanny verbalizes understanding of the findings of todays visit and agrees with plan of treatment. I have discussed any further diagnostic evaluation that may be needed or ordered today. We also reviewed his medications today. he has been encouraged to call the office with any questions or concerns that should arise related to todays visit.   Orders Placed This Encounter  Procedures   Chlamydia/GC NAA, Confirmation   Chlamydia/GC NAA, Confirmation   HgB A1c   HIV Antibody (routine testing w rflx)   RPR Qual    No orders of the defined types were placed in this encounter.   Time spent:10 [place] [facility] Family Nurse Practitioner

## 2022-03-26 LAB — HEMOGLOBIN A1C
Est. average glucose Bld gHb Est-mCnc: 108 mg/dL
Hgb A1c MFr Bld: 5.4 % (ref 4.8–5.6)

## 2022-03-26 LAB — HIV ANTIBODY (ROUTINE TESTING W REFLEX): HIV Screen 4th Generation wRfx: NONREACTIVE

## 2022-03-26 LAB — RPR QUALITATIVE: RPR Ser Ql: NONREACTIVE

## 2022-03-27 ENCOUNTER — Encounter: Payer: Self-pay | Admitting: Nurse Practitioner

## 2022-03-27 DIAGNOSIS — F603 Borderline personality disorder: Secondary | ICD-10-CM | POA: Diagnosis not present

## 2022-03-27 DIAGNOSIS — F330 Major depressive disorder, recurrent, mild: Secondary | ICD-10-CM | POA: Diagnosis not present

## 2022-03-28 DIAGNOSIS — G4700 Insomnia, unspecified: Secondary | ICD-10-CM | POA: Diagnosis not present

## 2022-03-28 DIAGNOSIS — F902 Attention-deficit hyperactivity disorder, combined type: Secondary | ICD-10-CM | POA: Diagnosis not present

## 2022-03-28 DIAGNOSIS — F411 Generalized anxiety disorder: Secondary | ICD-10-CM | POA: Diagnosis not present

## 2022-03-28 LAB — CHLAMYDIA/GC NAA, CONFIRMATION
Chlamydia trachomatis, NAA: NEGATIVE
Chlamydia trachomatis, NAA: NEGATIVE
Neisseria gonorrhoeae, NAA: NEGATIVE
Neisseria gonorrhoeae, NAA: NEGATIVE

## 2022-04-06 DIAGNOSIS — F603 Borderline personality disorder: Secondary | ICD-10-CM | POA: Diagnosis not present

## 2022-04-06 DIAGNOSIS — F330 Major depressive disorder, recurrent, mild: Secondary | ICD-10-CM | POA: Diagnosis not present

## 2022-04-20 ENCOUNTER — Encounter: Payer: Self-pay | Admitting: Nurse Practitioner

## 2022-04-20 DIAGNOSIS — F411 Generalized anxiety disorder: Secondary | ICD-10-CM | POA: Diagnosis not present

## 2022-04-20 DIAGNOSIS — F902 Attention-deficit hyperactivity disorder, combined type: Secondary | ICD-10-CM | POA: Diagnosis not present

## 2022-04-20 DIAGNOSIS — G4700 Insomnia, unspecified: Secondary | ICD-10-CM | POA: Diagnosis not present

## 2022-04-24 DIAGNOSIS — F330 Major depressive disorder, recurrent, mild: Secondary | ICD-10-CM | POA: Diagnosis not present

## 2022-04-24 DIAGNOSIS — F603 Borderline personality disorder: Secondary | ICD-10-CM | POA: Diagnosis not present

## 2022-05-11 DIAGNOSIS — F603 Borderline personality disorder: Secondary | ICD-10-CM | POA: Diagnosis not present

## 2022-05-11 DIAGNOSIS — F330 Major depressive disorder, recurrent, mild: Secondary | ICD-10-CM | POA: Diagnosis not present

## 2022-05-19 DIAGNOSIS — F340 Cyclothymic disorder: Secondary | ICD-10-CM | POA: Diagnosis not present

## 2022-05-19 DIAGNOSIS — F902 Attention-deficit hyperactivity disorder, combined type: Secondary | ICD-10-CM | POA: Diagnosis not present

## 2022-05-19 DIAGNOSIS — F4312 Post-traumatic stress disorder, chronic: Secondary | ICD-10-CM | POA: Diagnosis not present

## 2022-05-20 ENCOUNTER — Other Ambulatory Visit: Payer: Self-pay | Admitting: Nurse Practitioner

## 2022-05-20 DIAGNOSIS — Z209 Contact with and (suspected) exposure to unspecified communicable disease: Secondary | ICD-10-CM

## 2022-05-26 DIAGNOSIS — F902 Attention-deficit hyperactivity disorder, combined type: Secondary | ICD-10-CM | POA: Diagnosis not present

## 2022-05-26 DIAGNOSIS — F340 Cyclothymic disorder: Secondary | ICD-10-CM | POA: Diagnosis not present

## 2022-05-26 DIAGNOSIS — F4312 Post-traumatic stress disorder, chronic: Secondary | ICD-10-CM | POA: Diagnosis not present

## 2022-06-09 DIAGNOSIS — F340 Cyclothymic disorder: Secondary | ICD-10-CM | POA: Diagnosis not present

## 2022-06-09 DIAGNOSIS — F902 Attention-deficit hyperactivity disorder, combined type: Secondary | ICD-10-CM | POA: Diagnosis not present

## 2022-06-09 DIAGNOSIS — F4312 Post-traumatic stress disorder, chronic: Secondary | ICD-10-CM | POA: Diagnosis not present

## 2022-06-16 DIAGNOSIS — F340 Cyclothymic disorder: Secondary | ICD-10-CM | POA: Diagnosis not present

## 2022-06-16 DIAGNOSIS — F902 Attention-deficit hyperactivity disorder, combined type: Secondary | ICD-10-CM | POA: Diagnosis not present

## 2022-06-16 DIAGNOSIS — F4312 Post-traumatic stress disorder, chronic: Secondary | ICD-10-CM | POA: Diagnosis not present

## 2022-06-19 DIAGNOSIS — F411 Generalized anxiety disorder: Secondary | ICD-10-CM | POA: Diagnosis not present

## 2022-06-19 DIAGNOSIS — F902 Attention-deficit hyperactivity disorder, combined type: Secondary | ICD-10-CM | POA: Diagnosis not present

## 2022-06-19 DIAGNOSIS — G4700 Insomnia, unspecified: Secondary | ICD-10-CM | POA: Diagnosis not present

## 2022-06-23 DIAGNOSIS — F340 Cyclothymic disorder: Secondary | ICD-10-CM | POA: Diagnosis not present

## 2022-06-23 DIAGNOSIS — F4312 Post-traumatic stress disorder, chronic: Secondary | ICD-10-CM | POA: Diagnosis not present

## 2022-06-23 DIAGNOSIS — F902 Attention-deficit hyperactivity disorder, combined type: Secondary | ICD-10-CM | POA: Diagnosis not present

## 2022-06-28 ENCOUNTER — Other Ambulatory Visit: Payer: Self-pay

## 2022-06-28 ENCOUNTER — Other Ambulatory Visit: Payer: Self-pay | Admitting: Nurse Practitioner

## 2022-06-28 ENCOUNTER — Ambulatory Visit (INDEPENDENT_AMBULATORY_CARE_PROVIDER_SITE_OTHER): Payer: Self-pay | Admitting: Nurse Practitioner

## 2022-06-28 ENCOUNTER — Encounter: Payer: Self-pay | Admitting: Nurse Practitioner

## 2022-06-28 VITALS — BP 118/72 | HR 72 | Temp 96.6°F | Wt 176.0 lb

## 2022-06-28 DIAGNOSIS — Z7184 Encounter for health counseling related to travel: Secondary | ICD-10-CM

## 2022-06-28 NOTE — Progress Notes (Signed)
[facility] [address], [place] [address] [phone]  Office Visit Note  Patient Name: Zachary Becker Date of Birth [birth year]  Medical Record number [id number]  Date of Service: 06/28/2022  Chief Complaint  Patient presents with   Letter for School/Work    Travelling to [place]     HPI  36 year old male presenting for request on assistance for travel letter.   Current Medication:  Outpatient Encounter Medications as of 06/28/2022  Medication Sig   DAYVIGO 10 MG TABS    DESCOVY 200-25 MG tablet TAKE 1 TABLET BY MOUTH DAILY   gabapentin (NEURONTIN) 300 MG capsule Take 300 mg by mouth 3 (three) times daily.   lurasidone (LATUDA) 20 MG TABS tablet Take 20 mg by mouth daily.   Turmeric 500 MG CAPS See admin instructions.   valACYclovir (VALTREX) 1000 MG tablet Take 1 tablet (1,000 mg total) by mouth 3 (three) times daily.   VYVANSE 40 MG capsule Take 40 mg by mouth daily.   [DISCONTINUED] busPIRone (BUSPAR) 10 MG tablet  (Patient not taking: Reported on 06/28/2022)   [DISCONTINUED] doxycycline (VIBRA-TABS) 100 MG tablet Take 1 tablet (100 mg total) by mouth 2 (two) times daily. (Patient not taking: Reported on 06/28/2022)   [DISCONTINUED] gabapentin (NEURONTIN) 600 MG tablet 1 tablet (Patient not taking: Reported on 06/28/2022)   [DISCONTINUED] lamoTRIgine (LAMICTAL) 25 MG tablet Take by mouth. (Patient not taking: Reported on 06/28/2022)   [DISCONTINUED] tadalafil (CIALIS) 5 MG tablet  (Patient not taking: Reported on 06/28/2022)   [DISCONTINUED] trimethoprim-polymyxin b (POLYTRIM) ophthalmic solution Place 1 drop into the left eye every 4 (four) hours. (Patient not taking: Reported on 06/28/2022)   [DISCONTINUED] VRAYLAR 1.5 MG capsule Take 1.5 mg by mouth daily. (Patient not taking: Reported on 06/28/2022)   No facility-administered encounter medications on file as of 06/28/2022.      Medical History: Past Medical History:  Diagnosis Date    Anxiety    Depression      Vital Signs: BP 118/72   Pulse 72   Temp (!) 96.6 F (35.9 C) (Tympanic)   Wt 176 lb (79.8 kg)   SpO2 95%   BMI 21.57 kg/m    Review of Systems  Constitutional: Negative.   HENT: Negative.    Respiratory: Negative.    Cardiovascular: Negative.   Gastrointestinal: Negative.   Genitourinary: Negative.   Musculoskeletal: Negative.   Neurological: Negative.   Psychiatric/Behavioral: Negative.      Physical Exam HENT:     Head: Normocephalic.  Skin:    General: Skin is warm.  Neurological:     General: No focal deficit present.     Mental Status: He is alert and oriented to person, place, and time. Mental status is at baseline.  Psychiatric:        Mood and Affect: Mood normal.        Behavior: Behavior normal.        Thought Content: Thought content normal.       Assessment/Plan: 1. Travel advice encounter Letter provided as requested RTC as needed    General Counseling: Finnigan verbalizes understanding of the findings of todays visit and agrees with plan of treatment. I have discussed any further diagnostic evaluation that may be needed or ordered today. We also reviewed his medications today. he has been encouraged to call the office with any questions or concerns that should arise related to todays visit.     Time spent:20 Minutes  Apolonio Schneiders [facility] Family Nurse Practitioner

## 2022-06-30 DIAGNOSIS — F902 Attention-deficit hyperactivity disorder, combined type: Secondary | ICD-10-CM | POA: Diagnosis not present

## 2022-06-30 DIAGNOSIS — F340 Cyclothymic disorder: Secondary | ICD-10-CM | POA: Diagnosis not present

## 2022-06-30 DIAGNOSIS — F4312 Post-traumatic stress disorder, chronic: Secondary | ICD-10-CM | POA: Diagnosis not present

## 2022-07-04 DIAGNOSIS — G4700 Insomnia, unspecified: Secondary | ICD-10-CM | POA: Diagnosis not present

## 2022-07-04 DIAGNOSIS — F411 Generalized anxiety disorder: Secondary | ICD-10-CM | POA: Diagnosis not present

## 2022-07-04 DIAGNOSIS — F902 Attention-deficit hyperactivity disorder, combined type: Secondary | ICD-10-CM | POA: Diagnosis not present

## 2022-07-04 DIAGNOSIS — F331 Major depressive disorder, recurrent, moderate: Secondary | ICD-10-CM | POA: Diagnosis not present

## 2022-07-14 DIAGNOSIS — F4312 Post-traumatic stress disorder, chronic: Secondary | ICD-10-CM | POA: Diagnosis not present

## 2022-07-14 DIAGNOSIS — F340 Cyclothymic disorder: Secondary | ICD-10-CM | POA: Diagnosis not present

## 2022-07-14 DIAGNOSIS — F902 Attention-deficit hyperactivity disorder, combined type: Secondary | ICD-10-CM | POA: Diagnosis not present

## 2022-07-25 DIAGNOSIS — F331 Major depressive disorder, recurrent, moderate: Secondary | ICD-10-CM | POA: Diagnosis not present

## 2022-07-25 DIAGNOSIS — G4700 Insomnia, unspecified: Secondary | ICD-10-CM | POA: Diagnosis not present

## 2022-07-25 DIAGNOSIS — F902 Attention-deficit hyperactivity disorder, combined type: Secondary | ICD-10-CM | POA: Diagnosis not present

## 2022-07-25 DIAGNOSIS — F411 Generalized anxiety disorder: Secondary | ICD-10-CM | POA: Diagnosis not present

## 2022-07-27 ENCOUNTER — Encounter: Payer: Self-pay | Admitting: Physician Assistant

## 2022-07-27 ENCOUNTER — Ambulatory Visit (INDEPENDENT_AMBULATORY_CARE_PROVIDER_SITE_OTHER): Payer: Self-pay | Admitting: Physician Assistant

## 2022-07-27 ENCOUNTER — Encounter: Payer: Self-pay | Admitting: Internal Medicine

## 2022-07-27 VITALS — BP 120/80 | HR 73 | Temp 98.7°F | Wt 188.0 lb

## 2022-07-27 DIAGNOSIS — Z113 Encounter for screening for infections with a predominantly sexual mode of transmission: Secondary | ICD-10-CM

## 2022-07-27 NOTE — Progress Notes (Signed)
[facility] [address],  [address]   Office Visit Note  Patient Name: Zachary Becker Date of Birth [birth year]  Medical Record number [id number]  Date of Service: 07/27/2022  Chief Complaint  Patient presents with   Labs Only    Wants to talk about getting labs. STI screening. No concerns or symptoms. Also, wants to get annal physical labs.      37 y/o M presents to the clinic for a routine STD screening. Pt currently denies any urinary symptoms, penile discharge. He states he was in a relationship last year that ended. Since then he has been with couple of partners and wants to screen for STDs. Pt is currently taking Descovy for PrEP.       Current Medication:  Outpatient Encounter Medications as of 07/27/2022  Medication Sig   DAYVIGO 10 MG TABS    DESCOVY 200-25 MG tablet TAKE 1 TABLET BY MOUTH DAILY   gabapentin (NEURONTIN) 300 MG capsule Take 300 mg by mouth 3 (three) times daily.   lurasidone (LATUDA) 20 MG TABS tablet Take 20 mg by mouth daily.   Turmeric 500 MG CAPS See admin instructions.   valACYclovir (VALTREX) 1000 MG tablet Take 1 tablet (1,000 mg total) by mouth 3 (three) times daily.   VYVANSE 40 MG capsule Take 40 mg by mouth daily.   No facility-administered encounter medications on file as of 07/27/2022.      Medical History: Past Medical History:  Diagnosis Date   Anxiety    Depression      Vital Signs: BP 120/80 (BP Location: Left Arm, Patient Position: Sitting, Cuff Size: Normal)   Pulse 73   Temp 98.7 F (37.1 C) (Tympanic)   Wt 188 lb (85.3 kg)   SpO2 98%   BMI 23.04 kg/m    Review of Systems  Constitutional: Negative.   Respiratory: Negative.    Cardiovascular: Negative.   Gastrointestinal: Negative.   Genitourinary: Negative.     Physical Exam Constitutional:      Appearance: Normal appearance.  HENT:     Head: Normocephalic and atraumatic.     Right Ear: External ear normal.     Left Ear:  External ear normal.  Eyes:     Extraocular Movements: Extraocular movements intact.  Cardiovascular:     Rate and Rhythm: Normal rate and regular rhythm.  Pulmonary:     Effort: Pulmonary effort is normal.     Breath sounds: Normal breath sounds.  Skin:    General: Skin is warm and dry.  Neurological:     Mental Status: He is alert and oriented to person, place, and time.  Psychiatric:        Mood and Affect: Mood normal.        Behavior: Behavior normal.       Assessment/Plan:  1. Screening examination for sexually transmitted disease - HIV Antibody (routine testing w rflx) - RPR Qual - Chlamydia/GC NAA, Confirmation - Chlamydia/GC NAA, Confirmation  Await test results. Will treat appropriately for any abnormal test results. Pt verbalized understanding and in agreement.  Discussed with patient to f/u with PCP for annual physicals and blood work. Pt verbalized understanding and in agreement.      General Counseling: Zachary Becker verbalizes understanding of the findings of todays visit and agrees with plan of treatment. I have discussed any further diagnostic evaluation that may be needed or ordered today. We also reviewed his medications today. he has been encouraged to call  the office with any questions or concerns that should arise related to todays visit.    Time spent:20 [place], [place] Physician Assistant

## 2022-07-28 DIAGNOSIS — F340 Cyclothymic disorder: Secondary | ICD-10-CM | POA: Diagnosis not present

## 2022-07-28 DIAGNOSIS — F902 Attention-deficit hyperactivity disorder, combined type: Secondary | ICD-10-CM | POA: Diagnosis not present

## 2022-07-28 DIAGNOSIS — F4312 Post-traumatic stress disorder, chronic: Secondary | ICD-10-CM | POA: Diagnosis not present

## 2022-07-28 LAB — RPR QUALITATIVE: RPR Ser Ql: NONREACTIVE

## 2022-07-28 LAB — HIV ANTIBODY (ROUTINE TESTING W REFLEX): HIV Screen 4th Generation wRfx: NONREACTIVE

## 2022-07-31 LAB — CHLAMYDIA/GC NAA, CONFIRMATION
Chlamydia trachomatis, NAA: NEGATIVE
Neisseria gonorrhoeae, NAA: NEGATIVE

## 2022-08-01 ENCOUNTER — Telehealth: Payer: Self-pay | Admitting: Physician Assistant

## 2022-08-01 DIAGNOSIS — G4700 Insomnia, unspecified: Secondary | ICD-10-CM | POA: Diagnosis not present

## 2022-08-01 DIAGNOSIS — F411 Generalized anxiety disorder: Secondary | ICD-10-CM | POA: Diagnosis not present

## 2022-08-01 DIAGNOSIS — F3181 Bipolar II disorder: Secondary | ICD-10-CM | POA: Diagnosis not present

## 2022-08-01 NOTE — Telephone Encounter (Signed)
Left a message on voicemail to review blood work results from last week.

## 2022-08-04 DIAGNOSIS — F340 Cyclothymic disorder: Secondary | ICD-10-CM | POA: Diagnosis not present

## 2022-08-04 DIAGNOSIS — F4312 Post-traumatic stress disorder, chronic: Secondary | ICD-10-CM | POA: Diagnosis not present

## 2022-08-04 DIAGNOSIS — F902 Attention-deficit hyperactivity disorder, combined type: Secondary | ICD-10-CM | POA: Diagnosis not present

## 2022-08-04 LAB — CHLAMYDIA/GC NAA, CONFIRMATION
Chlamydia trachomatis, NAA: NEGATIVE
Neisseria gonorrhoeae, NAA: NEGATIVE

## 2022-08-08 ENCOUNTER — Ambulatory Visit: Payer: BC Managed Care – PPO | Admitting: Internal Medicine

## 2022-08-08 DIAGNOSIS — F411 Generalized anxiety disorder: Secondary | ICD-10-CM | POA: Diagnosis not present

## 2022-08-08 DIAGNOSIS — G4700 Insomnia, unspecified: Secondary | ICD-10-CM | POA: Diagnosis not present

## 2022-08-08 DIAGNOSIS — F902 Attention-deficit hyperactivity disorder, combined type: Secondary | ICD-10-CM | POA: Diagnosis not present

## 2022-08-08 DIAGNOSIS — F331 Major depressive disorder, recurrent, moderate: Secondary | ICD-10-CM | POA: Diagnosis not present

## 2022-08-10 ENCOUNTER — Ambulatory Visit: Payer: BC Managed Care – PPO | Admitting: Internal Medicine

## 2022-08-10 ENCOUNTER — Encounter: Payer: Self-pay | Admitting: Internal Medicine

## 2022-08-10 VITALS — BP 118/82 | HR 82 | Temp 98.1°F | Resp 16 | Ht 75.0 in | Wt 187.0 lb

## 2022-08-10 DIAGNOSIS — Z7251 High risk heterosexual behavior: Secondary | ICD-10-CM | POA: Diagnosis not present

## 2022-08-10 DIAGNOSIS — Z2981 Encounter for HIV pre-exposure prophylaxis: Secondary | ICD-10-CM | POA: Diagnosis not present

## 2022-08-10 DIAGNOSIS — R7989 Other specified abnormal findings of blood chemistry: Secondary | ICD-10-CM | POA: Diagnosis not present

## 2022-08-10 DIAGNOSIS — R7303 Prediabetes: Secondary | ICD-10-CM

## 2022-08-10 DIAGNOSIS — Z209 Contact with and (suspected) exposure to unspecified communicable disease: Secondary | ICD-10-CM

## 2022-08-10 MED ORDER — DESCOVY 200-25 MG PO TABS: 1.0000 | ORAL_TABLET | Freq: Every day | ORAL | 0 refills | Status: DC

## 2022-08-10 NOTE — Progress Notes (Signed)
Subjective:  Patient ID: Zachary Becker, male    DOB: Apr 07, 1986  Age: 37 y.o. MRN: [id number]  CC: Follow-up   HPI Zachary Becker presents for f/up -  He has been followed elsewhere recently for management of PrEP.  He continues to consider his sexual activities risky enough that he wants to continue taking PrEP.  He feels well today and offers no complaints.  Outpatient Medications Prior to Visit  Medication Sig Dispense Refill   DAYVIGO 10 MG TABS      gabapentin (NEURONTIN) 300 MG capsule Take 300 mg by mouth 3 (three) times daily.     lurasidone (LATUDA) 20 MG TABS tablet Take 20 mg by mouth daily.     Turmeric 500 MG CAPS See admin instructions.     valACYclovir (VALTREX) 1000 MG tablet Take 1 tablet (1,000 mg total) by mouth 3 (three) times daily. 15 tablet 1   VYVANSE 40 MG capsule Take 40 mg by mouth daily.     DESCOVY 200-25 MG tablet TAKE 1 TABLET BY MOUTH DAILY 30 tablet 2   No facility-administered medications prior to visit.    ROS Review of Systems  Constitutional: Negative.  Negative for chills, fatigue and fever.  HENT: Negative.  Negative for sore throat and trouble swallowing.   Eyes: Negative.   Respiratory:  Negative for cough, shortness of breath and wheezing.   Cardiovascular:  Negative for chest pain, palpitations and leg swelling.  Gastrointestinal:  Negative for abdominal pain, constipation, diarrhea, nausea and vomiting.  Endocrine: Negative.   Genitourinary:  Negative for difficulty urinating, dysuria, hematuria, penile discharge, scrotal swelling, testicular pain and urgency.  Musculoskeletal: Negative.  Negative for arthralgias, joint swelling and myalgias.  Skin: Negative.  Negative for rash.  Neurological: Negative.  Negative for weakness.  Hematological:  Negative for adenopathy. Does not bruise/bleed easily.  Psychiatric/Behavioral: Negative.      Objective:  BP 118/82 (BP Location: Right Arm, Patient Position: Sitting,  Cuff Size: Large)   Pulse 82   Temp 98.1 F (36.7 C) (Oral)   Resp 16   Ht '6\' 3"'$  (1.905 m)   Wt 187 lb (84.8 kg)   SpO2 98%   BMI 23.37 kg/m   BP Readings from Last 3 Encounters:  08/10/22 118/82  07/27/22 120/80  06/28/22 118/72    Wt Readings from Last 3 Encounters:  08/10/22 187 lb (84.8 kg)  07/27/22 188 lb (85.3 kg)  06/28/22 176 lb (79.8 kg)    Physical Exam Vitals reviewed.  Constitutional:      Appearance: Normal appearance.  HENT:     Nose: Nose normal.     Mouth/Throat:     Mouth: Mucous membranes are moist.  Eyes:     General: No scleral icterus.    Conjunctiva/sclera: Conjunctivae normal.  Cardiovascular:     Rate and Rhythm: Normal rate and regular rhythm.     Heart sounds: No murmur heard. Pulmonary:     Effort: Pulmonary effort is normal.     Breath sounds: No stridor. No wheezing, rhonchi or rales.  Abdominal:     General: Abdomen is flat.     Palpations: There is no mass.     Tenderness: There is no abdominal tenderness. There is no guarding.     Hernia: No hernia is present.  Musculoskeletal:        General: Normal range of motion.     Cervical back: Neck supple.     Right lower leg: No  edema.     Left lower leg: No edema.  Lymphadenopathy:     Cervical: No cervical adenopathy.  Skin:    Coloration: Skin is not pale.     Findings: No rash.  Neurological:     General: No focal deficit present.     Mental Status: He is alert. Mental status is at baseline.  Psychiatric:        Mood and Affect: Mood normal.        Behavior: Behavior normal.     Lab Results  Component Value Date   WBC 8.3 08/10/2022   HGB 14.3 08/10/2022   HCT 41.3 08/10/2022   PLT 263.0 08/10/2022   GLUCOSE 89 08/10/2022   CHOL 145 06/29/2021   TRIG 73 06/29/2021   HDL 44 06/29/2021   LDLCALC 87 06/29/2021   ALT 17 08/10/2022   AST 17 08/10/2022   NA 138 08/10/2022   K 4.5 08/10/2022   CL 101 08/10/2022   CREATININE 1.03 08/10/2022   BUN 19 08/10/2022    CO2 28 08/10/2022   TSH 1.300 06/29/2021   INR 1.1 (H) 05/18/2020   HGBA1C 5.3 08/10/2022    No results found.  Assessment & Plan:   Zachary Becker was seen today for follow-up.  Diagnoses and all orders for this visit:  Elevated LFTs- His liver enzymes are normal now. -     Hepatic function panel; Future -     CBC with Differential/Platelet; Future -     CBC with Differential/Platelet -     Hepatic function panel  Encounter for HIV pre-exposure prophylaxis- His renal function is normal.  He can continue taking Descovy. -     Basic metabolic panel; Future -     Hepatic function panel; Future -     CBC with Differential/Platelet; Future -     CBC with Differential/Platelet -     Hepatic function panel -     Basic metabolic panel -     emtricitabine-tenofovir AF (DESCOVY) 200-25 MG tablet; Take 1 tablet by mouth daily.  Prediabetes- His A1c is normal. -     Basic metabolic panel; Future -     Hemoglobin A1c; Future -     CBC with Differential/Platelet; Future -     CBC with Differential/Platelet -     Hemoglobin A1c -     Basic metabolic panel  Sexual behavior with high risk of exposure to communicable disease -     emtricitabine-tenofovir AF (DESCOVY) 200-25 MG tablet; Take 1 tablet by mouth daily.   I have changed Zachary T. Scali Becker "Tal"'s Descovy. I am also having him maintain his DayVigo, valACYclovir, Vyvanse, Turmeric, lurasidone, and gabapentin.  Meds ordered this encounter  Medications   emtricitabine-tenofovir AF (DESCOVY) 200-25 MG tablet    Sig: Take 1 tablet by mouth daily.    Dispense:  90 tablet    Refill:  0     Follow-up: Return in about 3 months (around 11/08/2022).  Scarlette Calico, MD

## 2022-08-10 NOTE — Patient Instructions (Signed)
Safe Sex Practicing safe sex means taking steps before and during sex to reduce your risk of: Getting an STI (sexually transmitted infection). Giving your partner an STI. Unwanted or unplanned pregnancy. How to practice safe sex Ways you can practice safe sex  Limit your sexual partners to only one partner who is having sex with only you. Avoid using alcohol and drugs before having sex. Alcohol and drugs can affect your judgment. Before having sex with a new partner: Talk to your partner about past partners, past STIs, and drug use. Get screened for STIs and discuss the results with your partner. Ask your partner to get screened too. Check your body regularly for sores, blisters, rashes, or unusual discharge. If you notice any of these problems, visit your health care provider. Avoid sexual contact if you have symptoms of an infection or you are being treated for an STI. While having sex, use a condom. Make sure to: Use a condom every time you have vaginal, oral, or anal sex. Both females and males should wear condoms during oral sex. Keep condoms in place from the beginning to the end of sexual activity. Use a latex condom, if possible. Latex condoms offer the best protection. Use only water-based lubricants with a condom. Using petroleum-based lubricants or oils will weaken the condom and increase the chance that it will break. Ways your health care provider can help you practice safe sex  See your health care provider for regular screenings, exams, and tests for STIs. Talk with your health care provider about what kind of birth control (contraception) is best for you. Get vaccinated against hepatitis B and human papillomavirus (HPV). If you are at risk of being infected with HIV (human immunodeficiency virus), talk with your health care provider about taking a prescription medicine to prevent HIV infection. You are at risk for HIV if you: Are a man who has sex with other men. Are  sexually active with more than one partner. Take drugs by injection. Have a sex partner who has HIV. Have unprotected sex. Have sex with someone who has sex with both men and women. Have had an STI. Follow these instructions at home: Take over-the-counter and prescription medicines only as told by your health care provider. Keep all follow-up visits. This is important. Where to find more information Centers for Disease Control and Prevention: www.cdc.gov Planned Parenthood: www.plannedparenthood.org Office on Women's Health: www.womenshealth.gov Summary Practicing safe sex means taking steps before and during sex to reduce your risk getting an STI, giving your partner an STI, and having an unwanted or unplanned pregnancy. Before having sex with a new partner, talk to your partner about past partners, past STIs, and drug use. Use a condom every time you have vaginal, oral, or anal sex. Both females and males should wear condoms during oral sex. Check your body regularly for sores, blisters, rashes, or unusual discharge. If you notice any of these problems, visit your health care provider. See your health care provider for regular screenings, exams, and tests for STIs. This information is not intended to replace advice given to you by your health care provider. Make sure you discuss any questions you have with your health care provider. Document Revised: 12/01/2019 Document Reviewed: 12/01/2019 Elsevier Patient Education  2023 Elsevier Inc.  

## 2022-08-11 DIAGNOSIS — F340 Cyclothymic disorder: Secondary | ICD-10-CM | POA: Diagnosis not present

## 2022-08-11 DIAGNOSIS — F4312 Post-traumatic stress disorder, chronic: Secondary | ICD-10-CM | POA: Diagnosis not present

## 2022-08-11 DIAGNOSIS — F902 Attention-deficit hyperactivity disorder, combined type: Secondary | ICD-10-CM | POA: Diagnosis not present

## 2022-08-11 LAB — CBC WITH DIFFERENTIAL/PLATELET
Basophils Absolute: 0.1 10*3/uL (ref 0.0–0.1)
Basophils Relative: 0.9 % (ref 0.0–3.0)
Eosinophils Absolute: 0.1 10*3/uL (ref 0.0–0.7)
Eosinophils Relative: 1.1 % (ref 0.0–5.0)
HCT: 41.3 % (ref 39.0–52.0)
Hemoglobin: 14.3 g/dL (ref 13.0–17.0)
Lymphocytes Relative: 27.9 % (ref 12.0–46.0)
Lymphs Abs: 2.3 10*3/uL (ref 0.7–4.0)
MCHC: 34.6 g/dL (ref 30.0–36.0)
MCV: 95.5 fl (ref 78.0–100.0)
Monocytes Absolute: 0.7 10*3/uL (ref 0.1–1.0)
Monocytes Relative: 9 % (ref 3.0–12.0)
Neutro Abs: 5.1 10*3/uL (ref 1.4–7.7)
Neutrophils Relative %: 61.1 % (ref 43.0–77.0)
Platelets: 263 10*3/uL (ref 150.0–400.0)
RBC: 4.33 Mil/uL (ref 4.22–5.81)
RDW: 12.4 % (ref 11.5–15.5)
WBC: 8.3 10*3/uL (ref 4.0–10.5)

## 2022-08-11 LAB — BASIC METABOLIC PANEL
BUN: 19 mg/dL (ref 6–23)
CO2: 28 mEq/L (ref 19–32)
Calcium: 9.2 mg/dL (ref 8.4–10.5)
Chloride: 101 mEq/L (ref 96–112)
Creatinine, Ser: 1.03 mg/dL (ref 0.40–1.50)
GFR: 92.77 mL/min (ref >60.00)
Glucose, Bld: 89 mg/dL (ref 70–99)
Potassium: 4.5 mEq/L (ref 3.5–5.1)
Sodium: 138 mEq/L (ref 135–145)

## 2022-08-11 LAB — HEPATIC FUNCTION PANEL
ALT: 17 U/L (ref 0–53)
AST: 17 U/L (ref 0–37)
Albumin: 4.7 g/dL (ref 3.5–5.2)
Alkaline Phosphatase: 46 U/L (ref 39–117)
Bilirubin, Direct: 0.1 mg/dL (ref 0.0–0.3)
Total Bilirubin: 0.6 mg/dL (ref 0.2–1.2)
Total Protein: 7.4 g/dL (ref 6.0–8.3)

## 2022-08-11 LAB — HEMOGLOBIN A1C: Hgb A1c MFr Bld: 5.3 % (ref 4.6–6.5)

## 2022-08-15 DIAGNOSIS — G4700 Insomnia, unspecified: Secondary | ICD-10-CM | POA: Diagnosis not present

## 2022-08-15 DIAGNOSIS — F902 Attention-deficit hyperactivity disorder, combined type: Secondary | ICD-10-CM | POA: Diagnosis not present

## 2022-08-15 DIAGNOSIS — F331 Major depressive disorder, recurrent, moderate: Secondary | ICD-10-CM | POA: Diagnosis not present

## 2022-08-15 DIAGNOSIS — F411 Generalized anxiety disorder: Secondary | ICD-10-CM | POA: Diagnosis not present

## 2022-08-18 DIAGNOSIS — F340 Cyclothymic disorder: Secondary | ICD-10-CM | POA: Diagnosis not present

## 2022-08-18 DIAGNOSIS — F4312 Post-traumatic stress disorder, chronic: Secondary | ICD-10-CM | POA: Diagnosis not present

## 2022-08-18 DIAGNOSIS — F902 Attention-deficit hyperactivity disorder, combined type: Secondary | ICD-10-CM | POA: Diagnosis not present

## 2022-08-29 DIAGNOSIS — F332 Major depressive disorder, recurrent severe without psychotic features: Secondary | ICD-10-CM | POA: Diagnosis not present

## 2022-09-01 DIAGNOSIS — F4312 Post-traumatic stress disorder, chronic: Secondary | ICD-10-CM | POA: Diagnosis not present

## 2022-09-01 DIAGNOSIS — F902 Attention-deficit hyperactivity disorder, combined type: Secondary | ICD-10-CM | POA: Diagnosis not present

## 2022-09-01 DIAGNOSIS — F340 Cyclothymic disorder: Secondary | ICD-10-CM | POA: Diagnosis not present

## 2022-09-05 ENCOUNTER — Other Ambulatory Visit: Payer: Self-pay

## 2022-09-05 DIAGNOSIS — F902 Attention-deficit hyperactivity disorder, combined type: Secondary | ICD-10-CM | POA: Diagnosis not present

## 2022-09-05 DIAGNOSIS — G4700 Insomnia, unspecified: Secondary | ICD-10-CM | POA: Diagnosis not present

## 2022-09-05 DIAGNOSIS — F331 Major depressive disorder, recurrent, moderate: Secondary | ICD-10-CM | POA: Diagnosis not present

## 2022-09-05 DIAGNOSIS — F411 Generalized anxiety disorder: Secondary | ICD-10-CM | POA: Diagnosis not present

## 2022-09-05 MED ORDER — LISDEXAMFETAMINE DIMESYLATE 40 MG PO CAPS: 40.0000 mg | ORAL_CAPSULE | Freq: Every morning | ORAL | 0 refills | Status: DC

## 2022-09-06 ENCOUNTER — Other Ambulatory Visit (HOSPITAL_BASED_OUTPATIENT_CLINIC_OR_DEPARTMENT_OTHER): Payer: Self-pay

## 2022-09-06 ENCOUNTER — Other Ambulatory Visit: Payer: Self-pay

## 2022-09-06 MED ORDER — LISDEXAMFETAMINE DIMESYLATE 40 MG PO CAPS
40.0000 mg | ORAL_CAPSULE | Freq: Every morning | ORAL | 0 refills | Status: DC
  Filled 2022-09-06: qty 30, 30d supply, fill #0

## 2022-09-08 DIAGNOSIS — F902 Attention-deficit hyperactivity disorder, combined type: Secondary | ICD-10-CM | POA: Diagnosis not present

## 2022-09-08 DIAGNOSIS — F340 Cyclothymic disorder: Secondary | ICD-10-CM | POA: Diagnosis not present

## 2022-09-08 DIAGNOSIS — F4312 Post-traumatic stress disorder, chronic: Secondary | ICD-10-CM | POA: Diagnosis not present

## 2022-09-12 DIAGNOSIS — F332 Major depressive disorder, recurrent severe without psychotic features: Secondary | ICD-10-CM | POA: Diagnosis not present

## 2022-09-13 DIAGNOSIS — F332 Major depressive disorder, recurrent severe without psychotic features: Secondary | ICD-10-CM | POA: Diagnosis not present

## 2022-09-14 DIAGNOSIS — F332 Major depressive disorder, recurrent severe without psychotic features: Secondary | ICD-10-CM | POA: Diagnosis not present

## 2022-09-15 DIAGNOSIS — F332 Major depressive disorder, recurrent severe without psychotic features: Secondary | ICD-10-CM | POA: Diagnosis not present

## 2022-09-18 DIAGNOSIS — F332 Major depressive disorder, recurrent severe without psychotic features: Secondary | ICD-10-CM | POA: Diagnosis not present

## 2022-09-19 DIAGNOSIS — F332 Major depressive disorder, recurrent severe without psychotic features: Secondary | ICD-10-CM | POA: Diagnosis not present

## 2022-09-20 DIAGNOSIS — F332 Major depressive disorder, recurrent severe without psychotic features: Secondary | ICD-10-CM | POA: Diagnosis not present

## 2022-09-21 DIAGNOSIS — F332 Major depressive disorder, recurrent severe without psychotic features: Secondary | ICD-10-CM | POA: Diagnosis not present

## 2022-09-22 DIAGNOSIS — F332 Major depressive disorder, recurrent severe without psychotic features: Secondary | ICD-10-CM | POA: Diagnosis not present

## 2022-09-25 DIAGNOSIS — F411 Generalized anxiety disorder: Secondary | ICD-10-CM | POA: Diagnosis not present

## 2022-09-25 DIAGNOSIS — G4700 Insomnia, unspecified: Secondary | ICD-10-CM | POA: Diagnosis not present

## 2022-09-25 DIAGNOSIS — F902 Attention-deficit hyperactivity disorder, combined type: Secondary | ICD-10-CM | POA: Diagnosis not present

## 2022-09-25 DIAGNOSIS — F331 Major depressive disorder, recurrent, moderate: Secondary | ICD-10-CM | POA: Diagnosis not present

## 2022-09-25 DIAGNOSIS — F332 Major depressive disorder, recurrent severe without psychotic features: Secondary | ICD-10-CM | POA: Diagnosis not present

## 2022-09-26 DIAGNOSIS — F332 Major depressive disorder, recurrent severe without psychotic features: Secondary | ICD-10-CM | POA: Diagnosis not present

## 2022-09-27 DIAGNOSIS — F332 Major depressive disorder, recurrent severe without psychotic features: Secondary | ICD-10-CM | POA: Diagnosis not present

## 2022-09-29 DIAGNOSIS — F332 Major depressive disorder, recurrent severe without psychotic features: Secondary | ICD-10-CM | POA: Diagnosis not present

## 2022-10-08 ENCOUNTER — Other Ambulatory Visit (HOSPITAL_BASED_OUTPATIENT_CLINIC_OR_DEPARTMENT_OTHER): Payer: Self-pay

## 2022-10-09 DIAGNOSIS — F332 Major depressive disorder, recurrent severe without psychotic features: Secondary | ICD-10-CM | POA: Diagnosis not present

## 2022-10-10 ENCOUNTER — Other Ambulatory Visit: Payer: Self-pay

## 2022-10-10 ENCOUNTER — Other Ambulatory Visit (HOSPITAL_BASED_OUTPATIENT_CLINIC_OR_DEPARTMENT_OTHER): Payer: Self-pay

## 2022-10-10 DIAGNOSIS — F332 Major depressive disorder, recurrent severe without psychotic features: Secondary | ICD-10-CM | POA: Diagnosis not present

## 2022-10-10 MED ORDER — LISDEXAMFETAMINE DIMESYLATE 40 MG PO CAPS
40.0000 mg | ORAL_CAPSULE | Freq: Every morning | ORAL | 0 refills | Status: DC
  Filled 2022-10-10: qty 30, 30d supply, fill #0

## 2022-10-11 DIAGNOSIS — F332 Major depressive disorder, recurrent severe without psychotic features: Secondary | ICD-10-CM | POA: Diagnosis not present

## 2022-10-12 ENCOUNTER — Other Ambulatory Visit (HOSPITAL_BASED_OUTPATIENT_CLINIC_OR_DEPARTMENT_OTHER): Payer: Self-pay

## 2022-10-12 DIAGNOSIS — F332 Major depressive disorder, recurrent severe without psychotic features: Secondary | ICD-10-CM | POA: Diagnosis not present

## 2022-10-13 ENCOUNTER — Other Ambulatory Visit (HOSPITAL_BASED_OUTPATIENT_CLINIC_OR_DEPARTMENT_OTHER): Payer: Self-pay

## 2022-10-13 DIAGNOSIS — F332 Major depressive disorder, recurrent severe without psychotic features: Secondary | ICD-10-CM | POA: Diagnosis not present

## 2022-10-13 MED ORDER — AMPHETAMINE-DEXTROAMPHETAMINE 7.5 MG PO TABS
7.5000 mg | ORAL_TABLET | Freq: Two times a day (BID) | ORAL | 0 refills | Status: DC
  Filled 2022-10-13: qty 60, 30d supply, fill #0

## 2022-10-16 DIAGNOSIS — F332 Major depressive disorder, recurrent severe without psychotic features: Secondary | ICD-10-CM | POA: Diagnosis not present

## 2022-10-17 DIAGNOSIS — F332 Major depressive disorder, recurrent severe without psychotic features: Secondary | ICD-10-CM | POA: Diagnosis not present

## 2022-10-18 DIAGNOSIS — F332 Major depressive disorder, recurrent severe without psychotic features: Secondary | ICD-10-CM | POA: Diagnosis not present

## 2022-10-19 DIAGNOSIS — F332 Major depressive disorder, recurrent severe without psychotic features: Secondary | ICD-10-CM | POA: Diagnosis not present

## 2022-10-20 DIAGNOSIS — F332 Major depressive disorder, recurrent severe without psychotic features: Secondary | ICD-10-CM | POA: Diagnosis not present

## 2022-10-23 DIAGNOSIS — F332 Major depressive disorder, recurrent severe without psychotic features: Secondary | ICD-10-CM | POA: Diagnosis not present

## 2022-10-24 DIAGNOSIS — F332 Major depressive disorder, recurrent severe without psychotic features: Secondary | ICD-10-CM | POA: Diagnosis not present

## 2022-10-25 DIAGNOSIS — F332 Major depressive disorder, recurrent severe without psychotic features: Secondary | ICD-10-CM | POA: Diagnosis not present

## 2022-10-26 DIAGNOSIS — F332 Major depressive disorder, recurrent severe without psychotic features: Secondary | ICD-10-CM | POA: Diagnosis not present

## 2022-10-27 DIAGNOSIS — F332 Major depressive disorder, recurrent severe without psychotic features: Secondary | ICD-10-CM | POA: Diagnosis not present

## 2022-10-30 DIAGNOSIS — F332 Major depressive disorder, recurrent severe without psychotic features: Secondary | ICD-10-CM | POA: Diagnosis not present

## 2022-10-31 DIAGNOSIS — F5105 Insomnia due to other mental disorder: Secondary | ICD-10-CM | POA: Diagnosis not present

## 2022-10-31 DIAGNOSIS — F331 Major depressive disorder, recurrent, moderate: Secondary | ICD-10-CM | POA: Diagnosis not present

## 2022-10-31 DIAGNOSIS — F332 Major depressive disorder, recurrent severe without psychotic features: Secondary | ICD-10-CM | POA: Diagnosis not present

## 2022-10-31 DIAGNOSIS — F603 Borderline personality disorder: Secondary | ICD-10-CM | POA: Diagnosis not present

## 2022-11-01 DIAGNOSIS — F332 Major depressive disorder, recurrent severe without psychotic features: Secondary | ICD-10-CM | POA: Diagnosis not present

## 2022-11-02 DIAGNOSIS — F332 Major depressive disorder, recurrent severe without psychotic features: Secondary | ICD-10-CM | POA: Diagnosis not present

## 2022-11-03 DIAGNOSIS — F4312 Post-traumatic stress disorder, chronic: Secondary | ICD-10-CM | POA: Diagnosis not present

## 2022-11-03 DIAGNOSIS — F340 Cyclothymic disorder: Secondary | ICD-10-CM | POA: Diagnosis not present

## 2022-11-03 DIAGNOSIS — F902 Attention-deficit hyperactivity disorder, combined type: Secondary | ICD-10-CM | POA: Diagnosis not present

## 2022-11-07 ENCOUNTER — Other Ambulatory Visit (HOSPITAL_BASED_OUTPATIENT_CLINIC_OR_DEPARTMENT_OTHER): Payer: Self-pay

## 2022-11-10 DIAGNOSIS — F4312 Post-traumatic stress disorder, chronic: Secondary | ICD-10-CM | POA: Diagnosis not present

## 2022-11-10 DIAGNOSIS — F340 Cyclothymic disorder: Secondary | ICD-10-CM | POA: Diagnosis not present

## 2022-11-10 DIAGNOSIS — F902 Attention-deficit hyperactivity disorder, combined type: Secondary | ICD-10-CM | POA: Diagnosis not present

## 2022-11-14 DIAGNOSIS — F603 Borderline personality disorder: Secondary | ICD-10-CM | POA: Diagnosis not present

## 2022-11-14 DIAGNOSIS — F5105 Insomnia due to other mental disorder: Secondary | ICD-10-CM | POA: Diagnosis not present

## 2022-11-14 DIAGNOSIS — F331 Major depressive disorder, recurrent, moderate: Secondary | ICD-10-CM | POA: Diagnosis not present

## 2022-11-16 ENCOUNTER — Ambulatory Visit (INDEPENDENT_AMBULATORY_CARE_PROVIDER_SITE_OTHER): Payer: Self-pay | Admitting: Adult Health

## 2022-11-16 VITALS — BP 112/78 | HR 86 | Temp 96.6°F

## 2022-11-16 DIAGNOSIS — H1032 Unspecified acute conjunctivitis, left eye: Secondary | ICD-10-CM

## 2022-11-16 MED ORDER — ERYTHROMYCIN 5 MG/GM OP OINT: 1.0000 | TOPICAL_OINTMENT | Freq: Every day | OPHTHALMIC | 0 refills | Status: DC

## 2022-11-16 MED ORDER — CIPROFLOXACIN HCL 0.3 % OP SOLN: 1.0000 [drp] | OPHTHALMIC | 0 refills | Status: DC

## 2022-11-16 NOTE — Progress Notes (Signed)
[facility] [address] [place], [place] [address]   Office Visit Note  Patient Name: Zachary Becker Date of Birth [birth year]  Medical Record number [id number]  Date of Service: 11/16/2022  No chief complaint on file.    HPI Pt is here for a sick visit.  He reports yesterday he cleaned off his porch, and noticed some discomfort in his left eye.  It does not feel like there is anything in it, however its irritated and watering this morning.    Current Medication:  Outpatient Encounter Medications as of 11/16/2022  Medication Sig   ciprofloxacin (CILOXAN) 0.3 % ophthalmic solution Place 1 drop into both eyes every 4 (four) hours while awake. Administer 1 drop, every 2 hours, while awake, for 2 days. Then 1 drop, every 4 hours, while awake, for the next 5 days.   amphetamine-dextroamphetamine (ADDERALL) 7.5 MG tablet Take 1 tablet by mouth 2 (two) times daily in the morning AND in the afternoon.   DAYVIGO 10 MG TABS    emtricitabine-tenofovir AF (DESCOVY) 200-25 MG tablet Take 1 tablet by mouth daily.   gabapentin (NEURONTIN) 300 MG capsule Take 300 mg by mouth 3 (three) times daily.   lurasidone (LATUDA) 20 MG TABS tablet Take 20 mg by mouth daily.   Turmeric 500 MG CAPS See admin instructions.   valACYclovir (VALTREX) 1000 MG tablet Take 1 tablet (1,000 mg total) by mouth 3 (three) times daily.   VYVANSE 40 MG capsule Take 40 mg by mouth daily.   No facility-administered encounter medications on file as of 11/16/2022.      Medical History: Past Medical History:  Diagnosis Date   Anxiety    Depression      Vital Signs: There were no vitals taken for this visit.   Review of Systems  Constitutional:  Negative for chills, fatigue and fever.  Eyes:  Positive for photophobia, pain, discharge and redness.    Physical Exam Vitals and nursing note reviewed.  Constitutional:      Appearance: Normal appearance.  Eyes:     General: Lids are normal.      Extraocular Movements: Extraocular movements intact.     Conjunctiva/sclera:     Left eye: Left conjunctiva is injected.     Comments: Left eye watering   Neurological:     Mental Status: He is alert.    Assessment/Plan: 1. Acute bacterial conjunctivitis of left eye Discussed use of drops during the day and ointment at night.   - ciprofloxacin (CILOXAN) 0.3 % ophthalmic solution; Place 1 drop into both eyes every 4 (four) hours while awake. Administer 1 drop, every 2 hours, while awake, for 2 days. Then 1 drop, every 4 hours, while awake, for the next 5 days.  Dispense: 5 mL; Refill: 0 - erythromycin ophthalmic ointment; Place 1 Application into both eyes at bedtime.  Dispense: 3.5 g; Refill: 0     General Counseling: Fabyan verbalizes understanding of the findings of todays visit and agrees with plan of treatment. I have discussed any further diagnostic evaluation that may be needed or ordered today. We also reviewed his medications today. he has been encouraged to call the office with any questions or concerns that should arise related to todays visit.   No orders of the defined types were placed in this encounter.   Meds ordered this encounter  Medications   ciprofloxacin (CILOXAN) 0.3 % ophthalmic solution    Sig: Place 1 drop into both eyes every 4 (  four) hours while awake. Administer 1 drop, every 2 hours, while awake, for 2 days. Then 1 drop, every 4 hours, while awake, for the next 5 days.    Dispense:  5 mL    Refill:  0    Time spent:15 Minutes    Johnna Acosta AGNP-C Nurse Practitioner

## 2022-11-23 ENCOUNTER — Other Ambulatory Visit: Payer: Self-pay | Admitting: Internal Medicine

## 2022-11-23 DIAGNOSIS — Z7251 High risk heterosexual behavior: Secondary | ICD-10-CM

## 2022-11-23 DIAGNOSIS — Z2981 Encounter for HIV pre-exposure prophylaxis: Secondary | ICD-10-CM

## 2022-11-24 DIAGNOSIS — F340 Cyclothymic disorder: Secondary | ICD-10-CM | POA: Diagnosis not present

## 2022-11-24 DIAGNOSIS — F902 Attention-deficit hyperactivity disorder, combined type: Secondary | ICD-10-CM | POA: Diagnosis not present

## 2022-11-24 DIAGNOSIS — F4312 Post-traumatic stress disorder, chronic: Secondary | ICD-10-CM | POA: Diagnosis not present

## 2022-11-29 DIAGNOSIS — F5105 Insomnia due to other mental disorder: Secondary | ICD-10-CM | POA: Diagnosis not present

## 2022-11-29 DIAGNOSIS — F331 Major depressive disorder, recurrent, moderate: Secondary | ICD-10-CM | POA: Diagnosis not present

## 2022-11-29 DIAGNOSIS — F603 Borderline personality disorder: Secondary | ICD-10-CM | POA: Diagnosis not present

## 2022-12-01 ENCOUNTER — Other Ambulatory Visit (HOSPITAL_BASED_OUTPATIENT_CLINIC_OR_DEPARTMENT_OTHER): Payer: Self-pay

## 2022-12-01 DIAGNOSIS — F340 Cyclothymic disorder: Secondary | ICD-10-CM | POA: Diagnosis not present

## 2022-12-01 DIAGNOSIS — F902 Attention-deficit hyperactivity disorder, combined type: Secondary | ICD-10-CM | POA: Diagnosis not present

## 2022-12-01 DIAGNOSIS — F4312 Post-traumatic stress disorder, chronic: Secondary | ICD-10-CM | POA: Diagnosis not present

## 2022-12-06 DIAGNOSIS — F902 Attention-deficit hyperactivity disorder, combined type: Secondary | ICD-10-CM | POA: Diagnosis not present

## 2022-12-06 DIAGNOSIS — F603 Borderline personality disorder: Secondary | ICD-10-CM | POA: Diagnosis not present

## 2022-12-08 DIAGNOSIS — F902 Attention-deficit hyperactivity disorder, combined type: Secondary | ICD-10-CM | POA: Diagnosis not present

## 2022-12-08 DIAGNOSIS — F340 Cyclothymic disorder: Secondary | ICD-10-CM | POA: Diagnosis not present

## 2022-12-08 DIAGNOSIS — F4312 Post-traumatic stress disorder, chronic: Secondary | ICD-10-CM | POA: Diagnosis not present

## 2022-12-15 DIAGNOSIS — F902 Attention-deficit hyperactivity disorder, combined type: Secondary | ICD-10-CM | POA: Diagnosis not present

## 2022-12-15 DIAGNOSIS — F4312 Post-traumatic stress disorder, chronic: Secondary | ICD-10-CM | POA: Diagnosis not present

## 2022-12-15 DIAGNOSIS — F340 Cyclothymic disorder: Secondary | ICD-10-CM | POA: Diagnosis not present

## 2022-12-19 ENCOUNTER — Other Ambulatory Visit: Payer: Self-pay

## 2022-12-19 DIAGNOSIS — Z5181 Encounter for therapeutic drug level monitoring: Secondary | ICD-10-CM

## 2022-12-19 NOTE — Progress Notes (Signed)
Patient seen in clinic for lab work ordered by outside provider. Results to be faxed to outside provider when received.

## 2022-12-20 LAB — COMPREHENSIVE METABOLIC PANEL
ALT: 22 IU/L (ref 0–44)
AST: 23 IU/L (ref 0–40)
Albumin/Globulin Ratio: 1.9
Albumin: 4.6 g/dL (ref 4.1–5.1)
Alkaline Phosphatase: 74 IU/L (ref 44–121)
BUN/Creatinine Ratio: 13 (ref 9–20)
BUN: 15 mg/dL (ref 6–20)
Bilirubin Total: 0.4 mg/dL (ref 0.0–1.2)
CO2: 24 mmol/L (ref 20–29)
Calcium: 9.4 mg/dL (ref 8.7–10.2)
Chloride: 101 mmol/L (ref 96–106)
Creatinine, Ser: 1.14 mg/dL (ref 0.76–1.27)
Globulin, Total: 2.4 g/dL (ref 1.5–4.5)
Glucose: 109 mg/dL — ABNORMAL HIGH (ref 70–99)
Potassium: 4.9 mmol/L (ref 3.5–5.2)
Sodium: 137 mmol/L (ref 134–144)
Total Protein: 7 g/dL (ref 6.0–8.5)
eGFR: 85 mL/min/{1.73_m2} (ref >59)

## 2022-12-21 ENCOUNTER — Ambulatory Visit (INDEPENDENT_AMBULATORY_CARE_PROVIDER_SITE_OTHER): Payer: Self-pay | Admitting: Adult Health

## 2022-12-21 ENCOUNTER — Encounter: Payer: Self-pay | Admitting: Adult Health

## 2022-12-21 VITALS — BP 126/87 | HR 99 | Temp 97.5°F | Resp 14 | Wt 195.2 lb

## 2022-12-21 DIAGNOSIS — Z113 Encounter for screening for infections with a predominantly sexual mode of transmission: Secondary | ICD-10-CM

## 2022-12-21 LAB — LITHIUM LEVEL: Lithium Lvl: 0.6 mmol/L (ref 0.5–1.2)

## 2022-12-21 LAB — TSH: TSH: 1.64 u[IU]/mL (ref 0.450–4.500)

## 2022-12-21 NOTE — Progress Notes (Signed)
[facility] [address] [place], [place] [address]   Office Visit Note  Patient Name: Zachary Becker Date of Birth [birth year]  Medical Record number [id number]  Date of Service: 12/21/2022  Chief Complaint  Patient presents with   Follow-up    Wants routine STI screening     HPI Pt is here for routine STI check.  He reports having a few male partners since last testing.  He is taking Descovy and has good compliance with this.  He participates in oral and anal sex (top).  Denies any symptoms at this time.    Current Medication:  Outpatient Encounter Medications as of 12/21/2022  Medication Sig   amphetamine-dextroamphetamine (ADDERALL) 7.5 MG tablet Take 1 tablet by mouth 2 (two) times daily in the morning AND in the afternoon.   ARIPiprazole, sensor, (ABILIFY MYCITE PO) Take 7.5 mg by mouth daily.   DAYVIGO 10 MG TABS    DESCOVY 200-25 MG tablet TAKE 1 TABLET BY MOUTH DAILY   gabapentin (NEURONTIN) 300 MG capsule Take 300 mg by mouth 3 (three) times daily.   lithium carbonate (LITHOBID) 300 MG ER tablet Take 600 mg by mouth daily.   valACYclovir (VALTREX) 1000 MG tablet Take 1 tablet (1,000 mg total) by mouth 3 (three) times daily.   lurasidone (LATUDA) 20 MG TABS tablet Take 20 mg by mouth daily.   [DISCONTINUED] ciprofloxacin (CILOXAN) 0.3 % ophthalmic solution Place 1 drop into both eyes every 4 (four) hours while awake. Administer 1 drop, every 2 hours, while awake, for 2 days. Then 1 drop, every 4 hours, while awake, for the next 5 days. (Patient not taking: Reported on 12/21/2022)   [DISCONTINUED] erythromycin ophthalmic ointment Place 1 Application into both eyes at bedtime. (Patient not taking: Reported on 12/21/2022)   [DISCONTINUED] Turmeric 500 MG CAPS See admin instructions. (Patient not taking: Reported on 12/21/2022)   [DISCONTINUED] VYVANSE 40 MG capsule Take 40 mg by mouth daily. (Patient not taking: Reported on 12/21/2022)   No facility-administered  encounter medications on file as of 12/21/2022.      Medical History: Past Medical History:  Diagnosis Date   Anxiety    Depression      Vital Signs: BP 126/87   Pulse 99   Temp (!) 97.5 F (36.4 C) (Tympanic)   Resp 14   Wt 195 lb 3.2 oz (88.5 kg)   SpO2 100%   BMI 24.40 kg/m    Review of Systems  Constitutional:  Negative for chills, fatigue and fever.  Genitourinary:  Negative for difficulty urinating, penile discharge, penile pain and testicular pain.    Physical Exam Vitals and nursing note reviewed.  Constitutional:      Appearance: Normal appearance.  HENT:     Head: Normocephalic.  Neurological:     Mental Status: He is alert.    Assessment/Plan: 1. Screening examination for venereal disease Results available in mychart. Discussed available testing options.  Patient elected for Gonorrhea, Chlamydia, Syphilis, HIV testing today.  Patient will be contacted via MyChart when labs results are available.  Encouraged consistent condom use, as well as regular testing (every 3-6 months) when changing sex partners, or when having multiple partners.    - GC/Chlamydia Probe Amp - HIV Antibody (routine testing w rflx) - RPR Qual - Ct/GC NAA, Pharyngeal     General Counseling: Jovahn verbalizes understanding of the findings of todays visit and agrees with plan of treatment. I have discussed any further diagnostic evaluation that  may be needed or ordered today. We also reviewed his medications today. he has been encouraged to call the office with any questions or concerns that should arise related to todays visit.   Orders Placed This Encounter  Procedures   GC/Chlamydia Probe Amp   Ct/GC NAA, Pharyngeal   HIV Antibody (routine testing w rflx)   RPR Qual    No orders of the defined types were placed in this encounter.   Time spent:15 Minutes    Johnna Acosta AGNP-C Nurse Practitioner

## 2022-12-22 DIAGNOSIS — F340 Cyclothymic disorder: Secondary | ICD-10-CM | POA: Diagnosis not present

## 2022-12-22 DIAGNOSIS — F902 Attention-deficit hyperactivity disorder, combined type: Secondary | ICD-10-CM | POA: Diagnosis not present

## 2022-12-22 DIAGNOSIS — F4312 Post-traumatic stress disorder, chronic: Secondary | ICD-10-CM | POA: Diagnosis not present

## 2022-12-23 LAB — RPR QUALITATIVE: RPR Ser Ql: NONREACTIVE

## 2022-12-23 LAB — HIV ANTIBODY (ROUTINE TESTING W REFLEX): HIV Screen 4th Generation wRfx: NONREACTIVE

## 2022-12-24 LAB — GC/CHLAMYDIA PROBE AMP
Chlamydia trachomatis, NAA: NEGATIVE
Neisseria Gonorrhoeae by PCR: NEGATIVE

## 2022-12-25 ENCOUNTER — Encounter: Payer: Self-pay | Admitting: Adult Health

## 2022-12-25 LAB — CT/GC NAA, PHARYNGEAL
C TRACH RRNA NPH QL PCR: NEGATIVE
N GONORRHOEA RRNA NPH QL PCR: NEGATIVE

## 2022-12-26 DIAGNOSIS — F603 Borderline personality disorder: Secondary | ICD-10-CM | POA: Diagnosis not present

## 2022-12-26 DIAGNOSIS — F331 Major depressive disorder, recurrent, moderate: Secondary | ICD-10-CM | POA: Diagnosis not present

## 2022-12-26 DIAGNOSIS — F5105 Insomnia due to other mental disorder: Secondary | ICD-10-CM | POA: Diagnosis not present

## 2022-12-29 DIAGNOSIS — F902 Attention-deficit hyperactivity disorder, combined type: Secondary | ICD-10-CM | POA: Diagnosis not present

## 2022-12-29 DIAGNOSIS — F4312 Post-traumatic stress disorder, chronic: Secondary | ICD-10-CM | POA: Diagnosis not present

## 2022-12-29 DIAGNOSIS — F340 Cyclothymic disorder: Secondary | ICD-10-CM | POA: Diagnosis not present

## 2023-01-02 DIAGNOSIS — F331 Major depressive disorder, recurrent, moderate: Secondary | ICD-10-CM | POA: Diagnosis not present

## 2023-01-02 DIAGNOSIS — F603 Borderline personality disorder: Secondary | ICD-10-CM | POA: Diagnosis not present

## 2023-01-02 DIAGNOSIS — F902 Attention-deficit hyperactivity disorder, combined type: Secondary | ICD-10-CM | POA: Diagnosis not present

## 2023-01-02 DIAGNOSIS — F411 Generalized anxiety disorder: Secondary | ICD-10-CM | POA: Diagnosis not present

## 2023-01-05 DIAGNOSIS — F902 Attention-deficit hyperactivity disorder, combined type: Secondary | ICD-10-CM | POA: Diagnosis not present

## 2023-01-05 DIAGNOSIS — F4312 Post-traumatic stress disorder, chronic: Secondary | ICD-10-CM | POA: Diagnosis not present

## 2023-01-05 DIAGNOSIS — F340 Cyclothymic disorder: Secondary | ICD-10-CM | POA: Diagnosis not present

## 2023-01-12 DIAGNOSIS — F4312 Post-traumatic stress disorder, chronic: Secondary | ICD-10-CM | POA: Diagnosis not present

## 2023-01-12 DIAGNOSIS — F902 Attention-deficit hyperactivity disorder, combined type: Secondary | ICD-10-CM | POA: Diagnosis not present

## 2023-01-12 DIAGNOSIS — F340 Cyclothymic disorder: Secondary | ICD-10-CM | POA: Diagnosis not present

## 2023-01-15 DIAGNOSIS — F603 Borderline personality disorder: Secondary | ICD-10-CM | POA: Diagnosis not present

## 2023-01-15 DIAGNOSIS — F419 Anxiety disorder, unspecified: Secondary | ICD-10-CM | POA: Diagnosis not present

## 2023-01-15 DIAGNOSIS — F3289 Other specified depressive episodes: Secondary | ICD-10-CM | POA: Diagnosis not present

## 2023-01-15 DIAGNOSIS — F1290 Cannabis use, unspecified, uncomplicated: Secondary | ICD-10-CM | POA: Insufficient documentation

## 2023-01-19 DIAGNOSIS — F902 Attention-deficit hyperactivity disorder, combined type: Secondary | ICD-10-CM | POA: Diagnosis not present

## 2023-01-19 DIAGNOSIS — F340 Cyclothymic disorder: Secondary | ICD-10-CM | POA: Diagnosis not present

## 2023-01-19 DIAGNOSIS — F4312 Post-traumatic stress disorder, chronic: Secondary | ICD-10-CM | POA: Diagnosis not present

## 2023-01-25 DIAGNOSIS — F1290 Cannabis use, unspecified, uncomplicated: Secondary | ICD-10-CM | POA: Diagnosis not present

## 2023-01-25 DIAGNOSIS — Z1331 Encounter for screening for depression: Secondary | ICD-10-CM | POA: Diagnosis not present

## 2023-01-25 DIAGNOSIS — F603 Borderline personality disorder: Secondary | ICD-10-CM | POA: Diagnosis not present

## 2023-01-25 DIAGNOSIS — F419 Anxiety disorder, unspecified: Secondary | ICD-10-CM | POA: Diagnosis not present

## 2023-01-25 DIAGNOSIS — F3289 Other specified depressive episodes: Secondary | ICD-10-CM | POA: Diagnosis not present

## 2023-01-26 DIAGNOSIS — F340 Cyclothymic disorder: Secondary | ICD-10-CM | POA: Diagnosis not present

## 2023-01-26 DIAGNOSIS — F902 Attention-deficit hyperactivity disorder, combined type: Secondary | ICD-10-CM | POA: Diagnosis not present

## 2023-01-26 DIAGNOSIS — F4312 Post-traumatic stress disorder, chronic: Secondary | ICD-10-CM | POA: Diagnosis not present

## 2023-01-31 DIAGNOSIS — D225 Melanocytic nevi of trunk: Secondary | ICD-10-CM | POA: Diagnosis not present

## 2023-01-31 DIAGNOSIS — L538 Other specified erythematous conditions: Secondary | ICD-10-CM | POA: Diagnosis not present

## 2023-01-31 DIAGNOSIS — F4323 Adjustment disorder with mixed anxiety and depressed mood: Secondary | ICD-10-CM | POA: Diagnosis not present

## 2023-01-31 DIAGNOSIS — B001 Herpesviral vesicular dermatitis: Secondary | ICD-10-CM | POA: Diagnosis not present

## 2023-01-31 DIAGNOSIS — D485 Neoplasm of uncertain behavior of skin: Secondary | ICD-10-CM | POA: Diagnosis not present

## 2023-01-31 DIAGNOSIS — L821 Other seborrheic keratosis: Secondary | ICD-10-CM | POA: Diagnosis not present

## 2023-01-31 DIAGNOSIS — L814 Other melanin hyperpigmentation: Secondary | ICD-10-CM | POA: Diagnosis not present

## 2023-02-02 DIAGNOSIS — F3289 Other specified depressive episodes: Secondary | ICD-10-CM | POA: Diagnosis not present

## 2023-02-02 DIAGNOSIS — F419 Anxiety disorder, unspecified: Secondary | ICD-10-CM | POA: Diagnosis not present

## 2023-02-09 DIAGNOSIS — F902 Attention-deficit hyperactivity disorder, combined type: Secondary | ICD-10-CM | POA: Diagnosis not present

## 2023-02-09 DIAGNOSIS — F4312 Post-traumatic stress disorder, chronic: Secondary | ICD-10-CM | POA: Diagnosis not present

## 2023-02-09 DIAGNOSIS — F340 Cyclothymic disorder: Secondary | ICD-10-CM | POA: Diagnosis not present

## 2023-02-15 DIAGNOSIS — F1290 Cannabis use, unspecified, uncomplicated: Secondary | ICD-10-CM | POA: Diagnosis not present

## 2023-02-15 DIAGNOSIS — F3289 Other specified depressive episodes: Secondary | ICD-10-CM | POA: Diagnosis not present

## 2023-02-15 DIAGNOSIS — F603 Borderline personality disorder: Secondary | ICD-10-CM | POA: Diagnosis not present

## 2023-02-15 DIAGNOSIS — F419 Anxiety disorder, unspecified: Secondary | ICD-10-CM | POA: Diagnosis not present

## 2023-02-23 DIAGNOSIS — F902 Attention-deficit hyperactivity disorder, combined type: Secondary | ICD-10-CM | POA: Diagnosis not present

## 2023-02-23 DIAGNOSIS — F340 Cyclothymic disorder: Secondary | ICD-10-CM | POA: Diagnosis not present

## 2023-02-23 DIAGNOSIS — F4312 Post-traumatic stress disorder, chronic: Secondary | ICD-10-CM | POA: Diagnosis not present

## 2023-03-02 DIAGNOSIS — F902 Attention-deficit hyperactivity disorder, combined type: Secondary | ICD-10-CM | POA: Diagnosis not present

## 2023-03-02 DIAGNOSIS — F4312 Post-traumatic stress disorder, chronic: Secondary | ICD-10-CM | POA: Diagnosis not present

## 2023-03-02 DIAGNOSIS — F340 Cyclothymic disorder: Secondary | ICD-10-CM | POA: Diagnosis not present

## 2023-03-16 DIAGNOSIS — F902 Attention-deficit hyperactivity disorder, combined type: Secondary | ICD-10-CM | POA: Diagnosis not present

## 2023-03-16 DIAGNOSIS — F4312 Post-traumatic stress disorder, chronic: Secondary | ICD-10-CM | POA: Diagnosis not present

## 2023-03-16 DIAGNOSIS — F1290 Cannabis use, unspecified, uncomplicated: Secondary | ICD-10-CM | POA: Diagnosis not present

## 2023-03-16 DIAGNOSIS — F603 Borderline personality disorder: Secondary | ICD-10-CM | POA: Diagnosis not present

## 2023-03-16 DIAGNOSIS — F340 Cyclothymic disorder: Secondary | ICD-10-CM | POA: Diagnosis not present

## 2023-03-16 DIAGNOSIS — F3289 Other specified depressive episodes: Secondary | ICD-10-CM | POA: Diagnosis not present

## 2023-03-16 DIAGNOSIS — F419 Anxiety disorder, unspecified: Secondary | ICD-10-CM | POA: Diagnosis not present

## 2023-03-19 ENCOUNTER — Other Ambulatory Visit: Payer: Self-pay | Admitting: Internal Medicine

## 2023-03-19 DIAGNOSIS — Z2981 Encounter for HIV pre-exposure prophylaxis: Secondary | ICD-10-CM

## 2023-03-19 DIAGNOSIS — Z209 Contact with and (suspected) exposure to unspecified communicable disease: Secondary | ICD-10-CM

## 2023-03-20 ENCOUNTER — Other Ambulatory Visit: Payer: Self-pay | Admitting: Internal Medicine

## 2023-03-20 DIAGNOSIS — Z2981 Encounter for HIV pre-exposure prophylaxis: Secondary | ICD-10-CM

## 2023-03-20 DIAGNOSIS — Z7251 High risk heterosexual behavior: Secondary | ICD-10-CM

## 2023-03-23 DIAGNOSIS — F4312 Post-traumatic stress disorder, chronic: Secondary | ICD-10-CM | POA: Diagnosis not present

## 2023-03-23 DIAGNOSIS — F902 Attention-deficit hyperactivity disorder, combined type: Secondary | ICD-10-CM | POA: Diagnosis not present

## 2023-03-23 DIAGNOSIS — F340 Cyclothymic disorder: Secondary | ICD-10-CM | POA: Diagnosis not present

## 2023-03-27 ENCOUNTER — Other Ambulatory Visit: Payer: Self-pay | Admitting: Physician Assistant

## 2023-03-27 ENCOUNTER — Encounter: Payer: Self-pay | Admitting: Adult Health

## 2023-03-27 DIAGNOSIS — Z7251 High risk heterosexual behavior: Secondary | ICD-10-CM

## 2023-03-27 DIAGNOSIS — Z2981 Encounter for HIV pre-exposure prophylaxis: Secondary | ICD-10-CM

## 2023-03-27 MED ORDER — DESCOVY 200-25 MG PO TABS: 1.0000 | ORAL_TABLET | Freq: Every day | ORAL | 0 refills | Status: DC

## 2023-03-27 NOTE — Telephone Encounter (Signed)
Pt sent a message requesting Descovy Rx refill. Rx sent to preferred pharmacy at [facility] in [place], [place]. Requested patient to schedule an appointment for STI screening labs. Last STI screening lab was performed in 12/2022.

## 2023-03-28 ENCOUNTER — Ambulatory Visit: Payer: Self-pay | Admitting: Physician Assistant

## 2023-03-30 DIAGNOSIS — F340 Cyclothymic disorder: Secondary | ICD-10-CM | POA: Diagnosis not present

## 2023-03-30 DIAGNOSIS — F902 Attention-deficit hyperactivity disorder, combined type: Secondary | ICD-10-CM | POA: Diagnosis not present

## 2023-03-30 DIAGNOSIS — F4312 Post-traumatic stress disorder, chronic: Secondary | ICD-10-CM | POA: Diagnosis not present

## 2023-04-03 ENCOUNTER — Other Ambulatory Visit: Payer: Self-pay

## 2023-04-03 ENCOUNTER — Encounter: Payer: Self-pay | Admitting: Physician Assistant

## 2023-04-03 ENCOUNTER — Ambulatory Visit (INDEPENDENT_AMBULATORY_CARE_PROVIDER_SITE_OTHER): Payer: Self-pay | Admitting: Physician Assistant

## 2023-04-03 VITALS — BP 110/84 | HR 81 | Temp 95.8°F | Ht 76.0 in | Wt 190.0 lb

## 2023-04-03 DIAGNOSIS — Z113 Encounter for screening for infections with a predominantly sexual mode of transmission: Secondary | ICD-10-CM

## 2023-04-03 DIAGNOSIS — Z7251 High risk heterosexual behavior: Secondary | ICD-10-CM

## 2023-04-03 DIAGNOSIS — Z209 Contact with and (suspected) exposure to unspecified communicable disease: Secondary | ICD-10-CM

## 2023-04-03 NOTE — Progress Notes (Signed)
[facility] [address] [place], [place] [address]   Office Visit Note  Patient Name: Zachary Becker Date of Birth [birth year]  Medical Record number [id number]  Date of Service: 04/03/2023  Chief Complaint  Patient presents with   office visit    Patient requesting STI screening.     37 y/o M presents to the clinic for requesting STI screening. PT currently on PreP medicine. Uses protection, condoms, for sexual activities. Denies any complaints or concerns at the present time.       Current Medication:  Outpatient Encounter Medications as of 04/03/2023  Medication Sig   DAYVIGO 10 MG TABS    emtricitabine-tenofovir AF (DESCOVY) 200-25 MG tablet Take 1 tablet by mouth daily.   JORNAY PM 80 MG CP24 Take 1 capsule by mouth at bedtime.   lithium carbonate (LITHOBID) 300 MG ER tablet Take 600 mg by mouth daily.   OLANZapine (ZYPREXA) 15 MG tablet Take 15 mg by mouth at bedtime.   valACYclovir (VALTREX) 1000 MG tablet Take 1 tablet (1,000 mg total) by mouth 3 (three) times daily. (Patient taking differently: Take 1,000 mg by mouth 3 (three) times daily as needed.)   valACYclovir (VALTREX) 500 MG tablet Take 500 mg by mouth daily.   [DISCONTINUED] amphetamine-dextroamphetamine (ADDERALL) 7.5 MG tablet Take 1 tablet by mouth 2 (two) times daily in the morning AND in the afternoon.   [DISCONTINUED] ARIPiprazole, sensor, (ABILIFY MYCITE PO) Take 7.5 mg by mouth daily.   [DISCONTINUED] gabapentin (NEURONTIN) 300 MG capsule Take 300 mg by mouth 3 (three) times daily.   [DISCONTINUED] lurasidone (LATUDA) 20 MG TABS tablet Take 20 mg by mouth daily.   No facility-administered encounter medications on file as of 04/03/2023.      Medical History: Past Medical History:  Diagnosis Date   Anxiety    Depression      Vital Signs: BP 110/84   Pulse 81   Temp (!) 95.8 F (35.4 C)   Ht 6\' 4"  (1.93 m)   Wt 190 lb (86.2 kg)   SpO2 96%   BMI 23.13 kg/m    Review of  Systems  Constitutional: Negative.   Genitourinary: Negative.   Skin: Negative.     Physical Exam Constitutional:      Appearance: Normal appearance.  HENT:     Head: Normocephalic and atraumatic.     Right Ear: External ear normal.     Left Ear: External ear normal.  Eyes:     Extraocular Movements: Extraocular movements intact.  Cardiovascular:     Rate and Rhythm: Normal rate and regular rhythm.  Pulmonary:     Effort: Pulmonary effort is normal.     Breath sounds: Normal breath sounds.  Skin:    General: Skin is warm and dry.  Neurological:     Mental Status: He is alert and oriented to person, place, and time.  Psychiatric:        Mood and Affect: Mood normal.        Behavior: Behavior normal.       Assessment/Plan:  1. Screening for STD (sexually transmitted disease) - HIV Antibody (routine testing w rflx) - RPR Qual - Chlamydia/GC NAA, Confirmation - Ct/GC NAA, Pharyngeal  2. Sexual behavior with high risk of exposure to communicable disease - HIV Antibody (routine testing w rflx) - RPR Qual  Pt declined rectal GC swab at today's visit.  Await test results. Continue to use protection for sexual activities. Pt will be  treated if any abnormal test results. Pt verbalized understanding and in agreement.   General Counseling: Zachary Becker verbalizes understanding of the findings of todays visit and agrees with plan of treatment. I have discussed any further diagnostic evaluation that may be needed or ordered today. We also reviewed his medications today. he has been encouraged to call the office with any questions or concerns that should arise related to todays visit.    Time spent:20 Minutes    Gilberto Better, [place] Physician Assistant

## 2023-04-04 LAB — CT/GC NAA, PHARYNGEAL
C TRACH RRNA NPH QL PCR: NEGATIVE
N GONORRHOEA RRNA NPH QL PCR: NEGATIVE

## 2023-04-04 LAB — RPR QUALITATIVE: RPR Ser Ql: NONREACTIVE

## 2023-04-04 LAB — HIV ANTIBODY (ROUTINE TESTING W REFLEX): HIV Screen 4th Generation wRfx: NONREACTIVE

## 2023-04-06 DIAGNOSIS — F902 Attention-deficit hyperactivity disorder, combined type: Secondary | ICD-10-CM | POA: Diagnosis not present

## 2023-04-06 DIAGNOSIS — F340 Cyclothymic disorder: Secondary | ICD-10-CM | POA: Diagnosis not present

## 2023-04-06 DIAGNOSIS — F4312 Post-traumatic stress disorder, chronic: Secondary | ICD-10-CM | POA: Diagnosis not present

## 2023-04-07 LAB — CHLAMYDIA/GC NAA, CONFIRMATION
Chlamydia trachomatis, NAA: NEGATIVE
Neisseria gonorrhoeae, NAA: NEGATIVE

## 2023-04-13 DIAGNOSIS — F340 Cyclothymic disorder: Secondary | ICD-10-CM | POA: Diagnosis not present

## 2023-04-13 DIAGNOSIS — F4312 Post-traumatic stress disorder, chronic: Secondary | ICD-10-CM | POA: Diagnosis not present

## 2023-04-13 DIAGNOSIS — F902 Attention-deficit hyperactivity disorder, combined type: Secondary | ICD-10-CM | POA: Diagnosis not present

## 2023-04-19 DIAGNOSIS — F840 Autistic disorder: Secondary | ICD-10-CM | POA: Diagnosis not present

## 2023-04-23 DIAGNOSIS — F422 Mixed obsessional thoughts and acts: Secondary | ICD-10-CM | POA: Diagnosis not present

## 2023-04-25 DIAGNOSIS — F840 Autistic disorder: Secondary | ICD-10-CM | POA: Diagnosis not present

## 2023-04-25 DIAGNOSIS — F422 Mixed obsessional thoughts and acts: Secondary | ICD-10-CM | POA: Diagnosis not present

## 2023-04-27 DIAGNOSIS — F3289 Other specified depressive episodes: Secondary | ICD-10-CM | POA: Diagnosis not present

## 2023-04-27 DIAGNOSIS — F1290 Cannabis use, unspecified, uncomplicated: Secondary | ICD-10-CM | POA: Diagnosis not present

## 2023-04-27 DIAGNOSIS — F419 Anxiety disorder, unspecified: Secondary | ICD-10-CM | POA: Diagnosis not present

## 2023-04-27 DIAGNOSIS — F603 Borderline personality disorder: Secondary | ICD-10-CM | POA: Diagnosis not present

## 2023-05-01 DIAGNOSIS — Z23 Encounter for immunization: Secondary | ICD-10-CM | POA: Diagnosis not present

## 2023-05-02 DIAGNOSIS — F422 Mixed obsessional thoughts and acts: Secondary | ICD-10-CM | POA: Diagnosis not present

## 2023-05-02 DIAGNOSIS — F840 Autistic disorder: Secondary | ICD-10-CM | POA: Diagnosis not present

## 2023-05-04 DIAGNOSIS — F422 Mixed obsessional thoughts and acts: Secondary | ICD-10-CM | POA: Diagnosis not present

## 2023-05-07 DIAGNOSIS — F422 Mixed obsessional thoughts and acts: Secondary | ICD-10-CM | POA: Diagnosis not present

## 2023-05-09 DIAGNOSIS — F422 Mixed obsessional thoughts and acts: Secondary | ICD-10-CM | POA: Diagnosis not present

## 2023-05-14 DIAGNOSIS — F422 Mixed obsessional thoughts and acts: Secondary | ICD-10-CM | POA: Diagnosis not present

## 2023-05-16 DIAGNOSIS — F422 Mixed obsessional thoughts and acts: Secondary | ICD-10-CM | POA: Diagnosis not present

## 2023-05-21 DIAGNOSIS — F422 Mixed obsessional thoughts and acts: Secondary | ICD-10-CM | POA: Diagnosis not present

## 2023-05-23 DIAGNOSIS — F3289 Other specified depressive episodes: Secondary | ICD-10-CM | POA: Diagnosis not present

## 2023-05-23 DIAGNOSIS — F422 Mixed obsessional thoughts and acts: Secondary | ICD-10-CM | POA: Diagnosis not present

## 2023-05-23 DIAGNOSIS — F603 Borderline personality disorder: Secondary | ICD-10-CM | POA: Diagnosis not present

## 2023-05-23 DIAGNOSIS — F419 Anxiety disorder, unspecified: Secondary | ICD-10-CM | POA: Diagnosis not present

## 2023-05-23 DIAGNOSIS — F1290 Cannabis use, unspecified, uncomplicated: Secondary | ICD-10-CM | POA: Diagnosis not present

## 2023-05-30 DIAGNOSIS — F422 Mixed obsessional thoughts and acts: Secondary | ICD-10-CM | POA: Diagnosis not present

## 2023-06-05 DIAGNOSIS — F422 Mixed obsessional thoughts and acts: Secondary | ICD-10-CM | POA: Diagnosis not present

## 2023-06-13 DIAGNOSIS — F902 Attention-deficit hyperactivity disorder, combined type: Secondary | ICD-10-CM | POA: Diagnosis not present

## 2023-06-13 DIAGNOSIS — F331 Major depressive disorder, recurrent, moderate: Secondary | ICD-10-CM | POA: Diagnosis not present

## 2023-06-13 DIAGNOSIS — F603 Borderline personality disorder: Secondary | ICD-10-CM | POA: Diagnosis not present

## 2023-06-13 DIAGNOSIS — F411 Generalized anxiety disorder: Secondary | ICD-10-CM | POA: Diagnosis not present

## 2023-06-14 ENCOUNTER — Other Ambulatory Visit: Payer: Self-pay

## 2023-06-14 ENCOUNTER — Ambulatory Visit (INDEPENDENT_AMBULATORY_CARE_PROVIDER_SITE_OTHER): Payer: Self-pay | Admitting: Adult Health

## 2023-06-14 ENCOUNTER — Encounter: Payer: Self-pay | Admitting: Adult Health

## 2023-06-14 VITALS — BP 114/80 | HR 92 | Temp 96.6°F | Ht 76.0 in | Wt 210.0 lb

## 2023-06-14 DIAGNOSIS — R635 Abnormal weight gain: Secondary | ICD-10-CM

## 2023-06-14 DIAGNOSIS — Z7189 Other specified counseling: Secondary | ICD-10-CM

## 2023-06-14 DIAGNOSIS — F422 Mixed obsessional thoughts and acts: Secondary | ICD-10-CM | POA: Diagnosis not present

## 2023-06-14 NOTE — Progress Notes (Signed)
[facility] [address] [place], [place] [address]   Office Visit Note  Patient Name: Zachary Becker Date of Birth [birth year]  Medical Record number [id number]  Date of Service: 06/14/2023  Chief Complaint  Patient presents with   Acute Visit    Patient reports 20# weight gain after taking Zyprexa. He also reports one high BP reading in the morning at home last week. He would also like to discuss concerns about the metformin he is taking.     HPI Pt is here for acute visit.  He has two concerns.  The first is that his blood pressure has been elevated at recent psychiatrist visits.  He does occasionally take a stimulant medication for ADHD but at the time of elevated pressures, he had not had it in a few weeks. He has taken it at home and did get one elevated pressure however he isn't sure he did it correctly.   Also he has been taking metformin to help with some weight gain from his Zyprexa, which he is currently weaning off of. He wants to see if he can continue to take metformin for a few months to see if it will help him lose some of his weight.  He reports gaining 25-30 pounds since initiating the Zyprexa.    Current Medication:  Outpatient Encounter Medications as of 06/14/2023  Medication Sig   ARIPiprazole (ABILIFY) 2 MG tablet Take 2 mg by mouth daily.   DAYVIGO 5 MG TABS Take 1 tablet by mouth at bedtime.   emtricitabine-tenofovir AF (DESCOVY) 200-25 MG tablet Take 1 tablet by mouth daily.   lithium carbonate (LITHOBID) 300 MG ER tablet Take 300 mg by mouth daily.   metFORMIN (GLUCOPHAGE) 500 MG tablet Take 500 mg by mouth 2 (two) times daily with a meal.   OLANZapine (ZYPREXA) 15 MG tablet Take 5 mg by mouth at bedtime.   valACYclovir (VALTREX) 1000 MG tablet Take 1 tablet (1,000 mg total) by mouth 3 (three) times daily. (Patient taking differently: Take 1,000 mg by mouth 3 (three) times daily as needed.)   valACYclovir (VALTREX) 500 MG tablet Take 500 mg by  mouth daily.   [DISCONTINUED] DAYVIGO 10 MG TABS    [DISCONTINUED] JORNAY PM 80 MG CP24 Take 1 capsule by mouth at bedtime.   No facility-administered encounter medications on file as of 06/14/2023.      Medical History: Past Medical History:  Diagnosis Date   Anxiety    Depression      Vital Signs: BP 114/80   Pulse 92   Temp (!) 96.6 F (35.9 C)   Ht 6\' 4"  (1.93 m)   Wt 210 lb (95.3 kg)   SpO2 99%   BMI 25.56 kg/m    Review of Systems  Constitutional:  Positive for unexpected weight change. Negative for chills, fatigue and fever.  Cardiovascular:  Negative for chest pain, palpitations and leg swelling.  Gastrointestinal:  Negative for diarrhea, nausea and vomiting.    Physical Exam Vitals reviewed.  Constitutional:      Appearance: Normal appearance.  HENT:     Head: Normocephalic.  Cardiovascular:     Rate and Rhythm: Normal rate.  Pulmonary:     Effort: Pulmonary effort is normal.     Breath sounds: Normal breath sounds.  Lymphadenopathy:     Cervical: No cervical adenopathy.  Neurological:     Mental Status: He is alert.    Assessment/Plan: 1. Counseling and coordination of care Continue  as discussed with BP log as necessary.   2. Weight gain Continue metformin as discussed. And continue weaning Zyprexa.      General Counseling: Phill verbalizes understanding of the findings of todays visit and agrees with plan of treatment. I have discussed any further diagnostic evaluation that may be needed or ordered today. We also reviewed his medications today. he has been encouraged to call the office with any questions or concerns that should arise related to todays visit.   No orders of the defined types were placed in this encounter.   No orders of the defined types were placed in this encounter.   Time spent:15 Minutes    Johnna Acosta AGNP-C Nurse Practitioner

## 2023-06-18 DIAGNOSIS — F422 Mixed obsessional thoughts and acts: Secondary | ICD-10-CM | POA: Diagnosis not present

## 2023-06-25 DIAGNOSIS — F422 Mixed obsessional thoughts and acts: Secondary | ICD-10-CM | POA: Diagnosis not present

## 2023-06-27 DIAGNOSIS — F331 Major depressive disorder, recurrent, moderate: Secondary | ICD-10-CM | POA: Diagnosis not present

## 2023-06-27 DIAGNOSIS — F603 Borderline personality disorder: Secondary | ICD-10-CM | POA: Diagnosis not present

## 2023-06-27 DIAGNOSIS — F902 Attention-deficit hyperactivity disorder, combined type: Secondary | ICD-10-CM | POA: Diagnosis not present

## 2023-06-27 DIAGNOSIS — F411 Generalized anxiety disorder: Secondary | ICD-10-CM | POA: Diagnosis not present

## 2023-07-02 DIAGNOSIS — F422 Mixed obsessional thoughts and acts: Secondary | ICD-10-CM | POA: Diagnosis not present

## 2023-07-06 DIAGNOSIS — F902 Attention-deficit hyperactivity disorder, combined type: Secondary | ICD-10-CM | POA: Diagnosis not present

## 2023-07-06 DIAGNOSIS — F340 Cyclothymic disorder: Secondary | ICD-10-CM | POA: Diagnosis not present

## 2023-07-06 DIAGNOSIS — F4312 Post-traumatic stress disorder, chronic: Secondary | ICD-10-CM | POA: Diagnosis not present

## 2023-07-13 DIAGNOSIS — F4312 Post-traumatic stress disorder, chronic: Secondary | ICD-10-CM | POA: Diagnosis not present

## 2023-07-13 DIAGNOSIS — F340 Cyclothymic disorder: Secondary | ICD-10-CM | POA: Diagnosis not present

## 2023-07-13 DIAGNOSIS — F902 Attention-deficit hyperactivity disorder, combined type: Secondary | ICD-10-CM | POA: Diagnosis not present

## 2023-07-16 ENCOUNTER — Ambulatory Visit: Payer: Self-pay | Admitting: Physician Assistant

## 2023-07-16 DIAGNOSIS — F902 Attention-deficit hyperactivity disorder, combined type: Secondary | ICD-10-CM | POA: Diagnosis not present

## 2023-07-16 DIAGNOSIS — F331 Major depressive disorder, recurrent, moderate: Secondary | ICD-10-CM | POA: Diagnosis not present

## 2023-07-16 DIAGNOSIS — F603 Borderline personality disorder: Secondary | ICD-10-CM | POA: Diagnosis not present

## 2023-07-16 DIAGNOSIS — F411 Generalized anxiety disorder: Secondary | ICD-10-CM | POA: Diagnosis not present

## 2023-07-17 ENCOUNTER — Encounter: Payer: Self-pay | Admitting: Physician Assistant

## 2023-07-20 DIAGNOSIS — F902 Attention-deficit hyperactivity disorder, combined type: Secondary | ICD-10-CM | POA: Diagnosis not present

## 2023-07-20 DIAGNOSIS — F340 Cyclothymic disorder: Secondary | ICD-10-CM | POA: Diagnosis not present

## 2023-07-20 DIAGNOSIS — F4312 Post-traumatic stress disorder, chronic: Secondary | ICD-10-CM | POA: Diagnosis not present

## 2023-07-22 ENCOUNTER — Other Ambulatory Visit: Payer: Self-pay | Admitting: Adult Health

## 2023-07-22 DIAGNOSIS — Z2981 Encounter for HIV pre-exposure prophylaxis: Secondary | ICD-10-CM

## 2023-07-22 DIAGNOSIS — Z7251 High risk heterosexual behavior: Secondary | ICD-10-CM

## 2023-07-22 MED ORDER — DESCOVY 200-25 MG PO TABS
1.0000 | ORAL_TABLET | Freq: Every day | ORAL | 0 refills | Status: DC
Start: 2023-07-22 — End: 2023-10-29

## 2023-07-27 DIAGNOSIS — F902 Attention-deficit hyperactivity disorder, combined type: Secondary | ICD-10-CM | POA: Diagnosis not present

## 2023-07-27 DIAGNOSIS — F340 Cyclothymic disorder: Secondary | ICD-10-CM | POA: Diagnosis not present

## 2023-07-27 DIAGNOSIS — F4312 Post-traumatic stress disorder, chronic: Secondary | ICD-10-CM | POA: Diagnosis not present

## 2023-07-31 ENCOUNTER — Ambulatory Visit: Payer: Self-pay | Admitting: Physician Assistant

## 2023-07-31 DIAGNOSIS — F331 Major depressive disorder, recurrent, moderate: Secondary | ICD-10-CM | POA: Diagnosis not present

## 2023-07-31 DIAGNOSIS — F603 Borderline personality disorder: Secondary | ICD-10-CM | POA: Diagnosis not present

## 2023-07-31 DIAGNOSIS — F902 Attention-deficit hyperactivity disorder, combined type: Secondary | ICD-10-CM | POA: Diagnosis not present

## 2023-07-31 DIAGNOSIS — F411 Generalized anxiety disorder: Secondary | ICD-10-CM | POA: Diagnosis not present

## 2023-08-03 DIAGNOSIS — F4312 Post-traumatic stress disorder, chronic: Secondary | ICD-10-CM | POA: Diagnosis not present

## 2023-08-03 DIAGNOSIS — F340 Cyclothymic disorder: Secondary | ICD-10-CM | POA: Diagnosis not present

## 2023-08-03 DIAGNOSIS — F902 Attention-deficit hyperactivity disorder, combined type: Secondary | ICD-10-CM | POA: Diagnosis not present

## 2023-08-10 DIAGNOSIS — F902 Attention-deficit hyperactivity disorder, combined type: Secondary | ICD-10-CM | POA: Diagnosis not present

## 2023-08-10 DIAGNOSIS — F340 Cyclothymic disorder: Secondary | ICD-10-CM | POA: Diagnosis not present

## 2023-08-10 DIAGNOSIS — F4312 Post-traumatic stress disorder, chronic: Secondary | ICD-10-CM | POA: Diagnosis not present

## 2023-08-16 ENCOUNTER — Other Ambulatory Visit: Payer: Self-pay | Admitting: Adult Health

## 2023-08-16 ENCOUNTER — Encounter: Payer: Self-pay | Admitting: Adult Health

## 2023-08-16 MED ORDER — METFORMIN HCL 500 MG PO TABS: 500.0000 mg | ORAL_TABLET | Freq: Two times a day (BID) | ORAL | 4 refills | Status: DC

## 2023-08-17 DIAGNOSIS — F340 Cyclothymic disorder: Secondary | ICD-10-CM | POA: Diagnosis not present

## 2023-08-17 DIAGNOSIS — F902 Attention-deficit hyperactivity disorder, combined type: Secondary | ICD-10-CM | POA: Diagnosis not present

## 2023-08-17 DIAGNOSIS — F4312 Post-traumatic stress disorder, chronic: Secondary | ICD-10-CM | POA: Diagnosis not present

## 2023-08-21 DIAGNOSIS — F331 Major depressive disorder, recurrent, moderate: Secondary | ICD-10-CM | POA: Diagnosis not present

## 2023-08-21 DIAGNOSIS — F902 Attention-deficit hyperactivity disorder, combined type: Secondary | ICD-10-CM | POA: Diagnosis not present

## 2023-08-21 DIAGNOSIS — F411 Generalized anxiety disorder: Secondary | ICD-10-CM | POA: Diagnosis not present

## 2023-08-21 DIAGNOSIS — F603 Borderline personality disorder: Secondary | ICD-10-CM | POA: Diagnosis not present

## 2023-08-24 DIAGNOSIS — F340 Cyclothymic disorder: Secondary | ICD-10-CM | POA: Diagnosis not present

## 2023-08-24 DIAGNOSIS — F902 Attention-deficit hyperactivity disorder, combined type: Secondary | ICD-10-CM | POA: Diagnosis not present

## 2023-08-24 DIAGNOSIS — F4312 Post-traumatic stress disorder, chronic: Secondary | ICD-10-CM | POA: Diagnosis not present

## 2023-08-31 DIAGNOSIS — F340 Cyclothymic disorder: Secondary | ICD-10-CM | POA: Diagnosis not present

## 2023-08-31 DIAGNOSIS — F4312 Post-traumatic stress disorder, chronic: Secondary | ICD-10-CM | POA: Diagnosis not present

## 2023-08-31 DIAGNOSIS — F902 Attention-deficit hyperactivity disorder, combined type: Secondary | ICD-10-CM | POA: Diagnosis not present

## 2023-09-04 DIAGNOSIS — F411 Generalized anxiety disorder: Secondary | ICD-10-CM | POA: Diagnosis not present

## 2023-09-04 DIAGNOSIS — F331 Major depressive disorder, recurrent, moderate: Secondary | ICD-10-CM | POA: Diagnosis not present

## 2023-09-04 DIAGNOSIS — F603 Borderline personality disorder: Secondary | ICD-10-CM | POA: Diagnosis not present

## 2023-09-04 DIAGNOSIS — F902 Attention-deficit hyperactivity disorder, combined type: Secondary | ICD-10-CM | POA: Diagnosis not present

## 2023-09-07 DIAGNOSIS — F4312 Post-traumatic stress disorder, chronic: Secondary | ICD-10-CM | POA: Diagnosis not present

## 2023-09-07 DIAGNOSIS — F902 Attention-deficit hyperactivity disorder, combined type: Secondary | ICD-10-CM | POA: Diagnosis not present

## 2023-09-07 DIAGNOSIS — F340 Cyclothymic disorder: Secondary | ICD-10-CM | POA: Diagnosis not present

## 2023-09-17 DIAGNOSIS — F422 Mixed obsessional thoughts and acts: Secondary | ICD-10-CM | POA: Diagnosis not present

## 2023-09-18 DIAGNOSIS — F902 Attention-deficit hyperactivity disorder, combined type: Secondary | ICD-10-CM | POA: Diagnosis not present

## 2023-09-18 DIAGNOSIS — F411 Generalized anxiety disorder: Secondary | ICD-10-CM | POA: Diagnosis not present

## 2023-09-18 DIAGNOSIS — F603 Borderline personality disorder: Secondary | ICD-10-CM | POA: Diagnosis not present

## 2023-09-18 DIAGNOSIS — F331 Major depressive disorder, recurrent, moderate: Secondary | ICD-10-CM | POA: Diagnosis not present

## 2023-09-20 ENCOUNTER — Ambulatory Visit: Payer: Self-pay | Admitting: Adult Health

## 2023-09-21 DIAGNOSIS — F902 Attention-deficit hyperactivity disorder, combined type: Secondary | ICD-10-CM | POA: Diagnosis not present

## 2023-09-21 DIAGNOSIS — F4312 Post-traumatic stress disorder, chronic: Secondary | ICD-10-CM | POA: Diagnosis not present

## 2023-09-21 DIAGNOSIS — F332 Major depressive disorder, recurrent severe without psychotic features: Secondary | ICD-10-CM | POA: Diagnosis not present

## 2023-09-21 DIAGNOSIS — F340 Cyclothymic disorder: Secondary | ICD-10-CM | POA: Diagnosis not present

## 2023-09-24 DIAGNOSIS — F422 Mixed obsessional thoughts and acts: Secondary | ICD-10-CM | POA: Diagnosis not present

## 2023-09-27 DIAGNOSIS — F902 Attention-deficit hyperactivity disorder, combined type: Secondary | ICD-10-CM | POA: Diagnosis not present

## 2023-09-27 DIAGNOSIS — F411 Generalized anxiety disorder: Secondary | ICD-10-CM | POA: Diagnosis not present

## 2023-09-27 DIAGNOSIS — F422 Mixed obsessional thoughts and acts: Secondary | ICD-10-CM | POA: Diagnosis not present

## 2023-09-27 DIAGNOSIS — F603 Borderline personality disorder: Secondary | ICD-10-CM | POA: Diagnosis not present

## 2023-09-27 DIAGNOSIS — F331 Major depressive disorder, recurrent, moderate: Secondary | ICD-10-CM | POA: Diagnosis not present

## 2023-09-28 DIAGNOSIS — F340 Cyclothymic disorder: Secondary | ICD-10-CM | POA: Diagnosis not present

## 2023-09-28 DIAGNOSIS — F4312 Post-traumatic stress disorder, chronic: Secondary | ICD-10-CM | POA: Diagnosis not present

## 2023-09-28 DIAGNOSIS — F902 Attention-deficit hyperactivity disorder, combined type: Secondary | ICD-10-CM | POA: Diagnosis not present

## 2023-10-03 DIAGNOSIS — F422 Mixed obsessional thoughts and acts: Secondary | ICD-10-CM | POA: Diagnosis not present

## 2023-10-03 DIAGNOSIS — F332 Major depressive disorder, recurrent severe without psychotic features: Secondary | ICD-10-CM | POA: Diagnosis not present

## 2023-10-04 DIAGNOSIS — F332 Major depressive disorder, recurrent severe without psychotic features: Secondary | ICD-10-CM | POA: Diagnosis not present

## 2023-10-05 DIAGNOSIS — F340 Cyclothymic disorder: Secondary | ICD-10-CM | POA: Diagnosis not present

## 2023-10-05 DIAGNOSIS — F332 Major depressive disorder, recurrent severe without psychotic features: Secondary | ICD-10-CM | POA: Diagnosis not present

## 2023-10-05 DIAGNOSIS — F4312 Post-traumatic stress disorder, chronic: Secondary | ICD-10-CM | POA: Diagnosis not present

## 2023-10-05 DIAGNOSIS — F902 Attention-deficit hyperactivity disorder, combined type: Secondary | ICD-10-CM | POA: Diagnosis not present

## 2023-10-08 DIAGNOSIS — F422 Mixed obsessional thoughts and acts: Secondary | ICD-10-CM | POA: Diagnosis not present

## 2023-10-08 DIAGNOSIS — F332 Major depressive disorder, recurrent severe without psychotic features: Secondary | ICD-10-CM | POA: Diagnosis not present

## 2023-10-09 DIAGNOSIS — F332 Major depressive disorder, recurrent severe without psychotic features: Secondary | ICD-10-CM | POA: Diagnosis not present

## 2023-10-10 DIAGNOSIS — F411 Generalized anxiety disorder: Secondary | ICD-10-CM | POA: Diagnosis not present

## 2023-10-10 DIAGNOSIS — F332 Major depressive disorder, recurrent severe without psychotic features: Secondary | ICD-10-CM | POA: Diagnosis not present

## 2023-10-10 DIAGNOSIS — F331 Major depressive disorder, recurrent, moderate: Secondary | ICD-10-CM | POA: Diagnosis not present

## 2023-10-10 DIAGNOSIS — F603 Borderline personality disorder: Secondary | ICD-10-CM | POA: Diagnosis not present

## 2023-10-10 DIAGNOSIS — F902 Attention-deficit hyperactivity disorder, combined type: Secondary | ICD-10-CM | POA: Diagnosis not present

## 2023-10-11 DIAGNOSIS — F332 Major depressive disorder, recurrent severe without psychotic features: Secondary | ICD-10-CM | POA: Diagnosis not present

## 2023-10-12 DIAGNOSIS — F332 Major depressive disorder, recurrent severe without psychotic features: Secondary | ICD-10-CM | POA: Diagnosis not present

## 2023-10-12 DIAGNOSIS — F4312 Post-traumatic stress disorder, chronic: Secondary | ICD-10-CM | POA: Diagnosis not present

## 2023-10-12 DIAGNOSIS — F340 Cyclothymic disorder: Secondary | ICD-10-CM | POA: Diagnosis not present

## 2023-10-12 DIAGNOSIS — F902 Attention-deficit hyperactivity disorder, combined type: Secondary | ICD-10-CM | POA: Diagnosis not present

## 2023-10-15 DIAGNOSIS — F332 Major depressive disorder, recurrent severe without psychotic features: Secondary | ICD-10-CM | POA: Diagnosis not present

## 2023-10-16 DIAGNOSIS — F332 Major depressive disorder, recurrent severe without psychotic features: Secondary | ICD-10-CM | POA: Diagnosis not present

## 2023-10-17 DIAGNOSIS — F332 Major depressive disorder, recurrent severe without psychotic features: Secondary | ICD-10-CM | POA: Diagnosis not present

## 2023-10-18 DIAGNOSIS — F332 Major depressive disorder, recurrent severe without psychotic features: Secondary | ICD-10-CM | POA: Diagnosis not present

## 2023-10-19 DIAGNOSIS — F422 Mixed obsessional thoughts and acts: Secondary | ICD-10-CM | POA: Diagnosis not present

## 2023-10-19 DIAGNOSIS — F332 Major depressive disorder, recurrent severe without psychotic features: Secondary | ICD-10-CM | POA: Diagnosis not present

## 2023-10-19 DIAGNOSIS — F603 Borderline personality disorder: Secondary | ICD-10-CM | POA: Diagnosis not present

## 2023-10-19 DIAGNOSIS — F902 Attention-deficit hyperactivity disorder, combined type: Secondary | ICD-10-CM | POA: Diagnosis not present

## 2023-10-19 DIAGNOSIS — F4312 Post-traumatic stress disorder, chronic: Secondary | ICD-10-CM | POA: Diagnosis not present

## 2023-10-22 DIAGNOSIS — F332 Major depressive disorder, recurrent severe without psychotic features: Secondary | ICD-10-CM | POA: Diagnosis not present

## 2023-10-23 DIAGNOSIS — F332 Major depressive disorder, recurrent severe without psychotic features: Secondary | ICD-10-CM | POA: Diagnosis not present

## 2023-10-24 DIAGNOSIS — F332 Major depressive disorder, recurrent severe without psychotic features: Secondary | ICD-10-CM | POA: Diagnosis not present

## 2023-10-25 DIAGNOSIS — F332 Major depressive disorder, recurrent severe without psychotic features: Secondary | ICD-10-CM | POA: Diagnosis not present

## 2023-10-26 DIAGNOSIS — F422 Mixed obsessional thoughts and acts: Secondary | ICD-10-CM | POA: Diagnosis not present

## 2023-10-26 DIAGNOSIS — F603 Borderline personality disorder: Secondary | ICD-10-CM | POA: Diagnosis not present

## 2023-10-26 DIAGNOSIS — F902 Attention-deficit hyperactivity disorder, combined type: Secondary | ICD-10-CM | POA: Diagnosis not present

## 2023-10-26 DIAGNOSIS — F4312 Post-traumatic stress disorder, chronic: Secondary | ICD-10-CM | POA: Diagnosis not present

## 2023-10-26 DIAGNOSIS — F332 Major depressive disorder, recurrent severe without psychotic features: Secondary | ICD-10-CM | POA: Diagnosis not present

## 2023-10-29 ENCOUNTER — Other Ambulatory Visit: Payer: Self-pay | Admitting: Adult Health

## 2023-10-29 DIAGNOSIS — Z209 Contact with and (suspected) exposure to unspecified communicable disease: Secondary | ICD-10-CM

## 2023-10-29 DIAGNOSIS — Z2981 Encounter for HIV pre-exposure prophylaxis: Secondary | ICD-10-CM

## 2023-10-29 DIAGNOSIS — F332 Major depressive disorder, recurrent severe without psychotic features: Secondary | ICD-10-CM | POA: Diagnosis not present

## 2023-10-29 DIAGNOSIS — Z7251 High risk heterosexual behavior: Secondary | ICD-10-CM

## 2023-10-30 DIAGNOSIS — F332 Major depressive disorder, recurrent severe without psychotic features: Secondary | ICD-10-CM | POA: Diagnosis not present

## 2023-10-30 DIAGNOSIS — F422 Mixed obsessional thoughts and acts: Secondary | ICD-10-CM | POA: Diagnosis not present

## 2023-10-31 DIAGNOSIS — F332 Major depressive disorder, recurrent severe without psychotic features: Secondary | ICD-10-CM | POA: Diagnosis not present

## 2023-11-01 DIAGNOSIS — F332 Major depressive disorder, recurrent severe without psychotic features: Secondary | ICD-10-CM | POA: Diagnosis not present

## 2023-11-02 DIAGNOSIS — F902 Attention-deficit hyperactivity disorder, combined type: Secondary | ICD-10-CM | POA: Diagnosis not present

## 2023-11-02 DIAGNOSIS — F4312 Post-traumatic stress disorder, chronic: Secondary | ICD-10-CM | POA: Diagnosis not present

## 2023-11-02 DIAGNOSIS — F422 Mixed obsessional thoughts and acts: Secondary | ICD-10-CM | POA: Diagnosis not present

## 2023-11-02 DIAGNOSIS — F603 Borderline personality disorder: Secondary | ICD-10-CM | POA: Diagnosis not present

## 2023-11-05 DIAGNOSIS — F332 Major depressive disorder, recurrent severe without psychotic features: Secondary | ICD-10-CM | POA: Diagnosis not present

## 2023-11-06 DIAGNOSIS — F332 Major depressive disorder, recurrent severe without psychotic features: Secondary | ICD-10-CM | POA: Diagnosis not present

## 2023-11-06 DIAGNOSIS — F902 Attention-deficit hyperactivity disorder, combined type: Secondary | ICD-10-CM | POA: Diagnosis not present

## 2023-11-06 DIAGNOSIS — F603 Borderline personality disorder: Secondary | ICD-10-CM | POA: Diagnosis not present

## 2023-11-06 DIAGNOSIS — F411 Generalized anxiety disorder: Secondary | ICD-10-CM | POA: Diagnosis not present

## 2023-11-06 DIAGNOSIS — F331 Major depressive disorder, recurrent, moderate: Secondary | ICD-10-CM | POA: Diagnosis not present

## 2023-11-07 ENCOUNTER — Ambulatory Visit: Payer: Self-pay | Admitting: Adult Health

## 2023-11-09 DIAGNOSIS — F4312 Post-traumatic stress disorder, chronic: Secondary | ICD-10-CM | POA: Diagnosis not present

## 2023-11-09 DIAGNOSIS — F902 Attention-deficit hyperactivity disorder, combined type: Secondary | ICD-10-CM | POA: Diagnosis not present

## 2023-11-09 DIAGNOSIS — F422 Mixed obsessional thoughts and acts: Secondary | ICD-10-CM | POA: Diagnosis not present

## 2023-11-09 DIAGNOSIS — F603 Borderline personality disorder: Secondary | ICD-10-CM | POA: Diagnosis not present

## 2023-11-09 DIAGNOSIS — F332 Major depressive disorder, recurrent severe without psychotic features: Secondary | ICD-10-CM | POA: Diagnosis not present

## 2023-11-12 DIAGNOSIS — F332 Major depressive disorder, recurrent severe without psychotic features: Secondary | ICD-10-CM | POA: Diagnosis not present

## 2023-11-13 DIAGNOSIS — F332 Major depressive disorder, recurrent severe without psychotic features: Secondary | ICD-10-CM | POA: Diagnosis not present

## 2023-11-14 DIAGNOSIS — F332 Major depressive disorder, recurrent severe without psychotic features: Secondary | ICD-10-CM | POA: Diagnosis not present

## 2023-11-19 DIAGNOSIS — F332 Major depressive disorder, recurrent severe without psychotic features: Secondary | ICD-10-CM | POA: Diagnosis not present

## 2023-11-23 DIAGNOSIS — F422 Mixed obsessional thoughts and acts: Secondary | ICD-10-CM | POA: Diagnosis not present

## 2023-11-23 DIAGNOSIS — F4312 Post-traumatic stress disorder, chronic: Secondary | ICD-10-CM | POA: Diagnosis not present

## 2023-11-23 DIAGNOSIS — F902 Attention-deficit hyperactivity disorder, combined type: Secondary | ICD-10-CM | POA: Diagnosis not present

## 2023-11-23 DIAGNOSIS — F603 Borderline personality disorder: Secondary | ICD-10-CM | POA: Diagnosis not present

## 2023-11-23 DIAGNOSIS — F332 Major depressive disorder, recurrent severe without psychotic features: Secondary | ICD-10-CM | POA: Diagnosis not present

## 2023-11-26 DIAGNOSIS — F422 Mixed obsessional thoughts and acts: Secondary | ICD-10-CM | POA: Diagnosis not present

## 2023-11-27 DIAGNOSIS — F332 Major depressive disorder, recurrent severe without psychotic features: Secondary | ICD-10-CM | POA: Diagnosis not present

## 2023-11-29 ENCOUNTER — Ambulatory Visit (INDEPENDENT_AMBULATORY_CARE_PROVIDER_SITE_OTHER): Payer: Self-pay | Admitting: Adult Health

## 2023-11-29 ENCOUNTER — Encounter: Payer: Self-pay | Admitting: Adult Health

## 2023-11-29 ENCOUNTER — Other Ambulatory Visit: Payer: Self-pay

## 2023-11-29 VITALS — BP 148/100 | HR 102 | Temp 96.4°F | Ht 76.0 in | Wt 195.0 lb

## 2023-11-29 DIAGNOSIS — F332 Major depressive disorder, recurrent severe without psychotic features: Secondary | ICD-10-CM | POA: Diagnosis not present

## 2023-11-29 DIAGNOSIS — Z113 Encounter for screening for infections with a predominantly sexual mode of transmission: Secondary | ICD-10-CM

## 2023-11-29 DIAGNOSIS — K644 Residual hemorrhoidal skin tags: Secondary | ICD-10-CM

## 2023-11-29 NOTE — Progress Notes (Signed)
 [facility] [address] [place], [place] [address]   Office Visit Note  Patient Name: Zachary Becker Date of Birth [birth year]  Medical Record number [id number]  Date of Service: 11/29/2023  Chief Complaint  Patient presents with   STD testing     HPI Pt is here for acute visit.  He is looking for a full STI screen He has had multiple partners in the last few months. He is on Prep. He does report that he bottomed for the first time in awhile, about 2 months ago. He feels something around his anus and wants that checked as well.    Current Medication:  Outpatient Encounter Medications as of 11/29/2023  Medication Sig   DAYVIGO 5 MG TABS Take 1 tablet by mouth at bedtime.   DESCOVY  200-25 MG tablet TAKE 1 TABLET BY MOUTH DAILY   JORNAY PM 20 MG 24 hr capsule Take 60 mg by mouth at bedtime.   valACYclovir  (VALTREX ) 1000 MG tablet Take 1 tablet (1,000 mg total) by mouth 3 (three) times daily. (Patient taking differently: Take 1,000 mg by mouth 3 (three) times daily as needed.)   valACYclovir  (VALTREX ) 500 MG tablet Take 500 mg by mouth daily.   [DISCONTINUED] ARIPiprazole (ABILIFY) 2 MG tablet Take 2 mg by mouth daily.   [DISCONTINUED] lithium  carbonate (LITHOBID) 300 MG ER tablet Take 300 mg by mouth daily.   [DISCONTINUED] metFORMIN  (GLUCOPHAGE ) 500 MG tablet Take 1 tablet (500 mg total) by mouth 2 (two) times daily with a meal.   [DISCONTINUED] OLANZapine (ZYPREXA) 15 MG tablet Take 5 mg by mouth at bedtime.   No facility-administered encounter medications on file as of 11/29/2023.      Medical History: Past Medical History:  Diagnosis Date   Anxiety    Depression      Vital Signs: BP (!) 148/100   Pulse (!) 102   Temp (!) 96.4 F (35.8 C)   Ht 6\' 4"  (1.93 m)   Wt 195 lb (88.5 kg)   SpO2 95%   BMI 23.74 kg/m    Review of Systems  Constitutional:  Negative for chills, fatigue and fever.  Eyes:  Negative for pain and itching.  Genitourinary:   Negative for dysuria, flank pain, hematuria, penile pain, scrotal swelling and testicular pain.  Skin:  Negative for rash.    Physical Exam Constitutional:      Appearance: Normal appearance.  HENT:     Head: Normocephalic.  Genitourinary:      Comments: Small external, non-inflamed hemorrhoid. Neurological:     Mental Status: He is alert.  Psychiatric:        Mood and Affect: Mood normal.     Assessment/Plan: 1. Screening examination for venereal disease (Primary) Discussed available testing options.  Patient elected for Gonorrhea, Chlamydia, Trichomonas, Syphilis, HIV testing today.  Patient will be contacted via MyChart when labs results are available.  Encouraged consistent condom use, as well as regular testing (every 3-6 months) when changing sex partners, or when having multiple partners.    - RPR Qual - HIV Antibody (routine testing w rflx) - Ct/GC NAA, Pharyngeal - CT/NG RNA, TMA Rectal - Chlamydia/Gonococcus/Trichomonas, NAA  2. External hemorrhoid Continue current regimen.  Avoid constipation/straining.      General Counseling: Zachary Becker verbalizes understanding of the findings of todays visit and agrees with plan of treatment. I have discussed any further diagnostic evaluation that may be needed or ordered today. We also reviewed his medications today. he  has been encouraged to call the office with any questions or concerns that should arise related to todays visit.   Orders Placed This Encounter  Procedures   Ct/GC NAA, Pharyngeal   CT/NG RNA, TMA Rectal   Chlamydia/Gonococcus/Trichomonas, NAA   RPR Qual   HIV Antibody (routine testing w rflx)    No orders of the defined types were placed in this encounter.   Time spent:15 Minutes    Sheria Dills AGNP-C Nurse Practitioner

## 2023-11-30 DIAGNOSIS — F902 Attention-deficit hyperactivity disorder, combined type: Secondary | ICD-10-CM | POA: Diagnosis not present

## 2023-11-30 DIAGNOSIS — F422 Mixed obsessional thoughts and acts: Secondary | ICD-10-CM | POA: Diagnosis not present

## 2023-11-30 DIAGNOSIS — F603 Borderline personality disorder: Secondary | ICD-10-CM | POA: Diagnosis not present

## 2023-11-30 DIAGNOSIS — F4312 Post-traumatic stress disorder, chronic: Secondary | ICD-10-CM | POA: Diagnosis not present

## 2023-11-30 LAB — RPR QUALITATIVE: RPR Ser Ql: NONREACTIVE

## 2023-11-30 LAB — HIV ANTIBODY (ROUTINE TESTING W REFLEX): HIV Screen 4th Generation wRfx: NONREACTIVE

## 2023-12-03 ENCOUNTER — Ambulatory Visit: Payer: Self-pay | Admitting: Adult Health

## 2023-12-03 LAB — CHLAMYDIA/GONOCOCCUS/TRICHOMONAS, NAA
Chlamydia by NAA: NEGATIVE
Gonococcus by NAA: NEGATIVE
Trich vag by NAA: NEGATIVE

## 2023-12-03 LAB — CT/NG RNA, TMA RECTAL
Chlamydia trachomatis, NAA: NEGATIVE
Neisseria gonorrhoeae, NAA: NEGATIVE

## 2023-12-03 LAB — CT/GC NAA, PHARYNGEAL
C TRACH RRNA NPH QL PCR: NEGATIVE
N GONORRHOEA RRNA NPH QL PCR: NEGATIVE

## 2023-12-04 DIAGNOSIS — F332 Major depressive disorder, recurrent severe without psychotic features: Secondary | ICD-10-CM | POA: Diagnosis not present

## 2023-12-04 DIAGNOSIS — F422 Mixed obsessional thoughts and acts: Secondary | ICD-10-CM | POA: Diagnosis not present

## 2023-12-06 DIAGNOSIS — F332 Major depressive disorder, recurrent severe without psychotic features: Secondary | ICD-10-CM | POA: Diagnosis not present

## 2023-12-07 DIAGNOSIS — F422 Mixed obsessional thoughts and acts: Secondary | ICD-10-CM | POA: Diagnosis not present

## 2023-12-07 DIAGNOSIS — F603 Borderline personality disorder: Secondary | ICD-10-CM | POA: Diagnosis not present

## 2023-12-07 DIAGNOSIS — F4312 Post-traumatic stress disorder, chronic: Secondary | ICD-10-CM | POA: Diagnosis not present

## 2023-12-07 DIAGNOSIS — F902 Attention-deficit hyperactivity disorder, combined type: Secondary | ICD-10-CM | POA: Diagnosis not present

## 2023-12-11 DIAGNOSIS — F332 Major depressive disorder, recurrent severe without psychotic features: Secondary | ICD-10-CM | POA: Diagnosis not present

## 2023-12-11 DIAGNOSIS — F422 Mixed obsessional thoughts and acts: Secondary | ICD-10-CM | POA: Diagnosis not present

## 2023-12-13 DIAGNOSIS — F332 Major depressive disorder, recurrent severe without psychotic features: Secondary | ICD-10-CM | POA: Diagnosis not present

## 2023-12-18 DIAGNOSIS — F331 Major depressive disorder, recurrent, moderate: Secondary | ICD-10-CM | POA: Diagnosis not present

## 2023-12-18 DIAGNOSIS — F603 Borderline personality disorder: Secondary | ICD-10-CM | POA: Diagnosis not present

## 2023-12-18 DIAGNOSIS — F411 Generalized anxiety disorder: Secondary | ICD-10-CM | POA: Diagnosis not present

## 2023-12-18 DIAGNOSIS — F902 Attention-deficit hyperactivity disorder, combined type: Secondary | ICD-10-CM | POA: Diagnosis not present

## 2023-12-18 DIAGNOSIS — F422 Mixed obsessional thoughts and acts: Secondary | ICD-10-CM | POA: Diagnosis not present

## 2023-12-21 DIAGNOSIS — F603 Borderline personality disorder: Secondary | ICD-10-CM | POA: Diagnosis not present

## 2023-12-21 DIAGNOSIS — F4312 Post-traumatic stress disorder, chronic: Secondary | ICD-10-CM | POA: Diagnosis not present

## 2023-12-21 DIAGNOSIS — F902 Attention-deficit hyperactivity disorder, combined type: Secondary | ICD-10-CM | POA: Diagnosis not present

## 2023-12-21 DIAGNOSIS — F422 Mixed obsessional thoughts and acts: Secondary | ICD-10-CM | POA: Diagnosis not present

## 2023-12-25 DIAGNOSIS — F422 Mixed obsessional thoughts and acts: Secondary | ICD-10-CM | POA: Diagnosis not present

## 2023-12-27 ENCOUNTER — Ambulatory Visit: Payer: Self-pay | Admitting: Adult Health

## 2023-12-28 ENCOUNTER — Ambulatory Visit (INDEPENDENT_AMBULATORY_CARE_PROVIDER_SITE_OTHER): Payer: Self-pay | Admitting: Adult Health

## 2023-12-28 ENCOUNTER — Other Ambulatory Visit: Payer: Self-pay

## 2023-12-28 ENCOUNTER — Encounter: Payer: Self-pay | Admitting: Adult Health

## 2023-12-28 VITALS — BP 126/92 | HR 68 | Temp 95.9°F | Ht 76.0 in

## 2023-12-28 DIAGNOSIS — F902 Attention-deficit hyperactivity disorder, combined type: Secondary | ICD-10-CM | POA: Diagnosis not present

## 2023-12-28 DIAGNOSIS — F603 Borderline personality disorder: Secondary | ICD-10-CM | POA: Diagnosis not present

## 2023-12-28 DIAGNOSIS — F422 Mixed obsessional thoughts and acts: Secondary | ICD-10-CM | POA: Diagnosis not present

## 2023-12-28 DIAGNOSIS — J029 Acute pharyngitis, unspecified: Secondary | ICD-10-CM

## 2023-12-28 DIAGNOSIS — F4312 Post-traumatic stress disorder, chronic: Secondary | ICD-10-CM | POA: Diagnosis not present

## 2023-12-28 DIAGNOSIS — Z113 Encounter for screening for infections with a predominantly sexual mode of transmission: Secondary | ICD-10-CM

## 2023-12-28 LAB — POC COVID19 BINAXNOW: SARS Coronavirus 2 Ag: NEGATIVE

## 2023-12-28 MED ORDER — AMOXICILLIN-POT CLAVULANATE 875-125 MG PO TABS: 1.0000 | ORAL_TABLET | Freq: Two times a day (BID) | ORAL | 0 refills | Status: DC

## 2023-12-28 NOTE — Progress Notes (Signed)
 [facility] [address] [place], [place] [address]   Office Visit Note  Patient Name: Zachary Becker Date of Birth [birth year]  Medical Record number [id number]  Date of Service: 12/28/2023  Chief Complaint  Patient presents with   Acute Visit    Patient c/o slight pain/pressure in his sinuses and drainage from his eyes. He also reports swelling of his R eye about 2-3 days ago which has since resolved. He felt pressure behind that eye at the time. Symptoms began about 7-10 days ago. He has been taking Mucinex and Advil which have been helpful but his symptoms are still persistent.      HPI Pt is here for a sick visit. He reports intermittent sinus congestion and sore throat. Denies fever, chills,  He is taking mucinex and advil which helps for awhile.   He also is requesting STI testing. He had Unprotected sex 2 weeks ago.     Current Medication:  Outpatient Encounter Medications as of 12/28/2023  Medication Sig   amoxicillin-clavulanate (AUGMENTIN) 875-125 MG tablet Take 1 tablet by mouth 2 (two) times daily.   DAYVIGO 5 MG TABS Take 1 tablet by mouth at bedtime.   DESCOVY  200-25 MG tablet TAKE 1 TABLET BY MOUTH DAILY   lisdexamfetamine (VYVANSE ) 20 MG capsule Take 20 mg by mouth daily.   valACYclovir  (VALTREX ) 1000 MG tablet Take 1 tablet (1,000 mg total) by mouth 3 (three) times daily.   valACYclovir  (VALTREX ) 500 MG tablet Take 500 mg by mouth daily.   JORNAY PM 20 MG 24 hr capsule Take 60 mg by mouth at bedtime.   No facility-administered encounter medications on file as of 12/28/2023.      Medical History: Past Medical History:  Diagnosis Date   Anxiety    Depression      Vital Signs: BP (!) 126/92   Pulse 68   Temp (!) 95.9 F (35.5 C)   Ht 6' 4 (1.93 m)   SpO2 96%   BMI 23.74 kg/m    Review of Systems  Constitutional:  Positive for fatigue. Negative for chills and fever.  HENT:  Positive for congestion and sore throat.   Eyes:   Negative for pain and itching.  Respiratory:  Negative for cough.   Cardiovascular:  Negative for chest pain.  Gastrointestinal:  Negative for diarrhea, nausea and vomiting.    Physical Exam Vitals reviewed.  Constitutional:      Appearance: Normal appearance.  HENT:     Head: Normocephalic.     Right Ear: Tympanic membrane and ear canal normal.     Left Ear: Tympanic membrane and ear canal normal.     Nose: Nose normal.     Mouth/Throat:     Mouth: Mucous membranes are moist.     Pharynx: No posterior oropharyngeal erythema.   Eyes:     Pupils: Pupils are equal, round, and reactive to light.    Cardiovascular:     Rate and Rhythm: Normal rate.  Pulmonary:     Effort: Pulmonary effort is normal.     Breath sounds: Normal breath sounds.  Lymphadenopathy:     Cervical: No cervical adenopathy.   Neurological:     Mental Status: He is alert.    Results for orders placed or performed in visit on 12/28/23 (from the past 24 hours)  POC COVID-19     Status: None   Collection Time: 12/28/23 10:28 AM  Result Value Ref Range   SARS Coronavirus  2 Ag Negative Negative    Assessment/Plan: 1. Sore throat (Primary) Watch and wait Augmentin prescribed.  - POC COVID-19  2. Screening examination for venereal disease Discussed available testing options.  Patient elected for Gonorrhea, Chlamydia, Trichomonas testing today.  Patient will be contacted via MyChart when labs results are available.  Encouraged consistent condom use, as well as regular testing (every 3-6 months) when changing sex partners, or when having multiple partners.    - Ct/GC NAA, Pharyngeal - Chlamydia/Gonococcus/Trichomonas, NAA     General Counseling: Stokes verbalizes understanding of the findings of todays visit and agrees with plan of treatment. I have discussed any further diagnostic evaluation that may be needed or ordered today. We also reviewed his medications today. he has been encouraged to call the office  with any questions or concerns that should arise related to todays visit.   Orders Placed This Encounter  Procedures   Ct/GC NAA, Pharyngeal   Chlamydia/Gonococcus/Trichomonas, NAA   POC COVID-19    Meds ordered this encounter  Medications   amoxicillin-clavulanate (AUGMENTIN) 875-125 MG tablet    Sig: Take 1 tablet by mouth 2 (two) times daily.    Dispense:  20 tablet    Refill:  0    Time spent:15 Minutes    Sheria Dills AGNP-C Nurse Practitioner

## 2024-01-01 LAB — CHLAMYDIA/GONOCOCCUS/TRICHOMONAS, NAA
Chlamydia by NAA: NEGATIVE
Gonococcus by NAA: NEGATIVE
Trich vag by NAA: NEGATIVE

## 2024-01-01 LAB — CT/GC NAA, PHARYNGEAL
C TRACH RRNA NPH QL PCR: NEGATIVE
N GONORRHOEA RRNA NPH QL PCR: NEGATIVE

## 2024-01-02 ENCOUNTER — Ambulatory Visit: Payer: Self-pay | Admitting: Adult Health

## 2024-01-02 DIAGNOSIS — F422 Mixed obsessional thoughts and acts: Secondary | ICD-10-CM | POA: Diagnosis not present

## 2024-01-09 DIAGNOSIS — F332 Major depressive disorder, recurrent severe without psychotic features: Secondary | ICD-10-CM | POA: Diagnosis not present

## 2024-01-09 DIAGNOSIS — F422 Mixed obsessional thoughts and acts: Secondary | ICD-10-CM | POA: Diagnosis not present

## 2024-01-14 DIAGNOSIS — F422 Mixed obsessional thoughts and acts: Secondary | ICD-10-CM | POA: Diagnosis not present

## 2024-01-18 DIAGNOSIS — F4312 Post-traumatic stress disorder, chronic: Secondary | ICD-10-CM | POA: Diagnosis not present

## 2024-01-18 DIAGNOSIS — F422 Mixed obsessional thoughts and acts: Secondary | ICD-10-CM | POA: Diagnosis not present

## 2024-01-18 DIAGNOSIS — F603 Borderline personality disorder: Secondary | ICD-10-CM | POA: Diagnosis not present

## 2024-01-18 DIAGNOSIS — F902 Attention-deficit hyperactivity disorder, combined type: Secondary | ICD-10-CM | POA: Diagnosis not present

## 2024-01-21 ENCOUNTER — Ambulatory Visit (INDEPENDENT_AMBULATORY_CARE_PROVIDER_SITE_OTHER): Payer: Self-pay | Admitting: Medical

## 2024-01-21 ENCOUNTER — Encounter: Payer: Self-pay | Admitting: Medical

## 2024-01-21 DIAGNOSIS — R197 Diarrhea, unspecified: Secondary | ICD-10-CM

## 2024-01-21 DIAGNOSIS — R509 Fever, unspecified: Secondary | ICD-10-CM

## 2024-01-21 DIAGNOSIS — R109 Unspecified abdominal pain: Secondary | ICD-10-CM

## 2024-01-21 NOTE — Patient Instructions (Signed)
-  Rest and stay well-hydrated.  Take frequent sips of water, sports drink or other electrolyte beverage (i.e. Pedialyte, Liquid IV).   -You may continue taking Imodium if needed for diarrhea. You may take Tylenol/Acetaminophen if needed for headache/fever. -Eat bland diet initially (banana, rice, applesauce, toast w/jam, oatmeal, crackers). Avoid spicy, fried, oily or acidic (i.e. citrus, tomato, tomato sauce) foods. If you are having diarrhea, avoid dairy products (i.e. milk, butter) until your stool returns to normal. -Send MyChart message to provider or schedule in-person visit as needed for new/worsening symptoms (such as reoccurring fever, bloody stool, increased abdominal pain) or if symptoms not improving over next 3-4 days.  Go to emergency department with severe abdominal pain or if unable to keep fluids down.

## 2024-01-21 NOTE — Progress Notes (Signed)
 [facility] [address] [place], [place] [address]   Office Visit Note  Patient Name: Zachary Becker Date of Birth [birth year]  Medical Record number [id number]  Date of Service: 01/21/2024  Chief Complaint  Patient presents with   Diarrhea   Fever     HPI Patient is a 38 YO male who is calling today with diarrhea and flu-like symptoms (including subjective fever). Visit conducted over phone per patient request.  Sx began about 5 days ago. Malaise initially, then subjective fever/chills, myalgia and diarrhea.  Fever lasted about 2 days, occasional chills since then. Diarrhea last 4 days, not bloody or black, average of 10x/day, frequency has decreased with use of Imodium.  Occasional nausea, not much appetite. Vomited once yesterday. Abdominal discomfort (not sharp, some pressure). No respiratory sx. Had diarrhea overnight one night, though it has not routinely affected sleep or woken him up at night. Thinks last had fever 2 nights ago. Mild HA with fever.   Imodium, 3x yesterday, once so far today. Taking Ibuprofen for body ache or fever.   No known sick contacts. [place] 4[place] of July. Did not eat anything unusual or questionable he can recall. No recent abx or international travel. Began taking fiber gummies last week. Took two at-home COVID tests last week, both negative.  Ate very little first couple days. Protein drinks yesterday. Drinking fluids,water and e-lytes.  Working from home today, off tomorrow, due back to work in person on 7/16.  Current Medication:  Outpatient Encounter Medications as of 01/21/2024  Medication Sig   DAYVIGO 5 MG TABS Take 1 tablet by mouth at bedtime.   DESCOVY  200-25 MG tablet TAKE 1 TABLET BY MOUTH DAILY   JORNAY PM 20 MG 24 hr capsule Take 60 mg by mouth at bedtime.   lisdexamfetamine (VYVANSE ) 20 MG capsule Take 20 mg by mouth daily.   valACYclovir  (VALTREX ) 1000 MG tablet Take 1 tablet (1,000 mg total) by mouth 3  (three) times daily.   valACYclovir  (VALTREX ) 500 MG tablet Take 500 mg by mouth daily.   [DISCONTINUED] amoxicillin -clavulanate (AUGMENTIN ) 875-125 MG tablet Take 1 tablet by mouth 2 (two) times daily.   No facility-administered encounter medications on file as of 01/21/2024.      Medical History: Past Medical History:  Diagnosis Date   Anxiety    Depression      Vital Signs: There were no vitals taken for this visit. Visit conducted over phone.   Review of Systems  Constitutional:  Positive for appetite change, chills, fatigue and fever.  HENT:  Negative for congestion, rhinorrhea, sinus pressure and sore throat.   Respiratory:  Negative for cough and shortness of breath.   Cardiovascular:  Negative for chest pain.  Gastrointestinal:  Positive for abdominal pain, diarrhea, nausea and vomiting (x1). Negative for anal bleeding and blood in stool.  Musculoskeletal:  Negative for neck stiffness.  Neurological:  Positive for headaches.    Physical Exam No exam performed, visit conducted by phone. Patient sounds alert, responds appropriately to questions.   Assessment/Plan: 1. Diarrhea, unspecified type (Primary) 2. Abdominal pain, unspecified abdominal location 3. Fever, unspecified fever cause  - Ova and parasite examination; Future - Clostridium Difficile by PCR([facility]); Future - Stool Culture; Future  Hx seems most suggestive of viral infection (enteritis or gastroenteritis). However, given persistence of diarrhea, offered option for stool studies. Patient would like to proceed with stool testing, orders sent to [facility]. Patient will present to [facility]  directly to pick up materials and to then return them once specimens are collected. Patient declined Rx anti-emetic.  In meantime, may continue Imodium, not more than recommended on package. Advised Tylenol (rather than Ibuprofen) if needed for fever/headache. Continue to hydrate with water and electrolytes. Eat  bland diet, avoid spicy/fried foods and dairy. Will follow up with results when available. Encouraged to schedule in person appointment or visit urgent care/emergency department if new/worsening symptoms develop (i.e. increased abdominal pain, bloody or black stool, recurring high fever).  Patient Instructions  -Rest and stay well-hydrated.  Take frequent sips of water, sports drink or other electrolyte beverage (i.e. Pedialyte, Liquid IV).   -You may continue taking Imodium if needed for diarrhea. You may take Tylenol/Acetaminophen if needed for headache/fever. -Eat bland diet initially (banana, rice, applesauce, toast w/jam, oatmeal, crackers). Avoid spicy, fried, oily or acidic (i.e. citrus, tomato, tomato sauce) foods. If you are having diarrhea, avoid dairy products (i.e. milk, butter) until your stool returns to normal. -Send MyChart message to provider or schedule in-person visit as needed for new/worsening symptoms (such as reoccurring fever, bloody stool, increased abdominal pain) or if symptoms not improving over next 3-4 days.  Go to emergency department with severe abdominal pain or if unable to keep fluids down.       General Counseling: Jaquarious verbalizes understanding of the findings of todays visit and plan of treatment.   he has been encouraged to call the office with any questions or concerns that should arise related to todays visit.   Time spent:20 Minutes    Joen Arts PA-C Physician Assistant

## 2024-01-23 DIAGNOSIS — F422 Mixed obsessional thoughts and acts: Secondary | ICD-10-CM | POA: Diagnosis not present

## 2024-01-25 DIAGNOSIS — F422 Mixed obsessional thoughts and acts: Secondary | ICD-10-CM | POA: Diagnosis not present

## 2024-01-25 DIAGNOSIS — F902 Attention-deficit hyperactivity disorder, combined type: Secondary | ICD-10-CM | POA: Diagnosis not present

## 2024-01-25 DIAGNOSIS — F4312 Post-traumatic stress disorder, chronic: Secondary | ICD-10-CM | POA: Diagnosis not present

## 2024-01-25 DIAGNOSIS — F603 Borderline personality disorder: Secondary | ICD-10-CM | POA: Diagnosis not present

## 2024-01-26 ENCOUNTER — Other Ambulatory Visit: Payer: Self-pay | Admitting: Adult Health

## 2024-01-26 DIAGNOSIS — Z2981 Encounter for HIV pre-exposure prophylaxis: Secondary | ICD-10-CM

## 2024-01-26 DIAGNOSIS — Z7251 High risk heterosexual behavior: Secondary | ICD-10-CM

## 2024-01-30 DIAGNOSIS — F422 Mixed obsessional thoughts and acts: Secondary | ICD-10-CM | POA: Diagnosis not present

## 2024-02-01 DIAGNOSIS — L821 Other seborrheic keratosis: Secondary | ICD-10-CM | POA: Diagnosis not present

## 2024-02-01 DIAGNOSIS — F422 Mixed obsessional thoughts and acts: Secondary | ICD-10-CM | POA: Diagnosis not present

## 2024-02-01 DIAGNOSIS — F603 Borderline personality disorder: Secondary | ICD-10-CM | POA: Diagnosis not present

## 2024-02-01 DIAGNOSIS — L738 Other specified follicular disorders: Secondary | ICD-10-CM | POA: Diagnosis not present

## 2024-02-01 DIAGNOSIS — F902 Attention-deficit hyperactivity disorder, combined type: Secondary | ICD-10-CM | POA: Diagnosis not present

## 2024-02-01 DIAGNOSIS — D225 Melanocytic nevi of trunk: Secondary | ICD-10-CM | POA: Diagnosis not present

## 2024-02-01 DIAGNOSIS — F4312 Post-traumatic stress disorder, chronic: Secondary | ICD-10-CM | POA: Diagnosis not present

## 2024-02-01 DIAGNOSIS — L814 Other melanin hyperpigmentation: Secondary | ICD-10-CM | POA: Diagnosis not present

## 2024-02-05 DIAGNOSIS — F422 Mixed obsessional thoughts and acts: Secondary | ICD-10-CM | POA: Diagnosis not present

## 2024-02-12 DIAGNOSIS — F902 Attention-deficit hyperactivity disorder, combined type: Secondary | ICD-10-CM | POA: Diagnosis not present

## 2024-02-12 DIAGNOSIS — F331 Major depressive disorder, recurrent, moderate: Secondary | ICD-10-CM | POA: Diagnosis not present

## 2024-02-12 DIAGNOSIS — F422 Mixed obsessional thoughts and acts: Secondary | ICD-10-CM | POA: Diagnosis not present

## 2024-02-12 DIAGNOSIS — F411 Generalized anxiety disorder: Secondary | ICD-10-CM | POA: Diagnosis not present

## 2024-02-12 DIAGNOSIS — F603 Borderline personality disorder: Secondary | ICD-10-CM | POA: Diagnosis not present

## 2024-02-15 DIAGNOSIS — F4312 Post-traumatic stress disorder, chronic: Secondary | ICD-10-CM | POA: Diagnosis not present

## 2024-02-15 DIAGNOSIS — F422 Mixed obsessional thoughts and acts: Secondary | ICD-10-CM | POA: Diagnosis not present

## 2024-02-15 DIAGNOSIS — F603 Borderline personality disorder: Secondary | ICD-10-CM | POA: Diagnosis not present

## 2024-02-15 DIAGNOSIS — F902 Attention-deficit hyperactivity disorder, combined type: Secondary | ICD-10-CM | POA: Diagnosis not present

## 2024-02-18 ENCOUNTER — Encounter: Payer: Self-pay | Admitting: Medical

## 2024-02-18 ENCOUNTER — Ambulatory Visit (INDEPENDENT_AMBULATORY_CARE_PROVIDER_SITE_OTHER): Payer: Self-pay | Admitting: Medical

## 2024-02-18 ENCOUNTER — Other Ambulatory Visit: Payer: Self-pay

## 2024-02-18 VITALS — BP 126/90 | HR 92 | Temp 96.6°F | Ht 76.0 in | Wt 190.0 lb

## 2024-02-18 DIAGNOSIS — R000 Tachycardia, unspecified: Secondary | ICD-10-CM

## 2024-02-18 DIAGNOSIS — F422 Mixed obsessional thoughts and acts: Secondary | ICD-10-CM | POA: Diagnosis not present

## 2024-02-18 NOTE — Progress Notes (Signed)
 [facility] [address] [place], [place] [address]   Office Visit Note  Patient Name: Zachary Becker Date of Birth [birth year]  Medical Record number [id number]  Date of Service: 02/18/2024  Chief Complaint  Patient presents with   Elevated heart rate    Patient reports getting elevated HR readings x 2 weeks. He states he is under a great deal of stress right now and believes that this may be contributing to it.     HPI 38 y.o. male presents with concerns regarding HR elevation.  Dealing with traumatic personal situation over last several weeks.  Received high HR notification from Apple watch today while driving into work, 882. Was also elevated about 2 weeks ago, when situation began, 02/05/24, 123-128, 121-138. Looking at notification hx, has had several of these elevated resting HR notifications over last couple months.  Has not been taking Vyvanse  as regularly lately, not while on vacation last couple weeks. Did not take over weekend but took this AM.  Not exercising as he usually does last couple months, under more stress.  Chest feels a little tight today, thinks this probably began after receiving notification, though not sure. Felt conscious of HR while laying in bed last night.   Hx of anxiety and depression. Does not take any medication for these conditions currently. Has taken meds in past but were either not effective or did not tolerate side effects. Did 8-week course of TMS treatment in spring 2025 for depression and OCD, was helpful. Sees therapist twice a week.  Not feeling ill. Slightly sweaty overnight few nights recently. No fever or chills. Felt little dehydrated yesterday, had HA, relieved with Ibuprofen. A little dizzy 2 days ago, attributed this to not eating as much as he should have. Last EtOH was 3 nights ago. Denies shortness of breath, syncope or presyncope. Denies irregular heart beat or sensation of missed beats. No current  dizziness or HA.  Father died in late 18s of Parkinson's. States mother's HR tends to run higher. No Fhx of CAD or arrhythmia. No personal or family hx of thyroid  d/o.   Has caffeine occasionally, not consistently and none in last few days. No recent medication changes.  Has a PCP in [place], last visit over a year ago.   Current Medication:  Outpatient Encounter Medications as of 02/18/2024  Medication Sig   DAYVIGO 5 MG TABS Take 1 tablet by mouth at bedtime.   DESCOVY  200-25 MG tablet TAKE 1 TABLET BY MOUTH DAILY   lisdexamfetamine (VYVANSE ) 20 MG capsule Take 20 mg by mouth daily.   valACYclovir  (VALTREX ) 1000 MG tablet Take 1 tablet (1,000 mg total) by mouth 3 (three) times daily. (Patient taking differently: Take 1,000 mg by mouth 3 (three) times daily as needed.)   valACYclovir  (VALTREX ) 500 MG tablet Take 500 mg by mouth daily.   [DISCONTINUED] JORNAY PM 20 MG 24 hr capsule Take 60 mg by mouth at bedtime. (Patient not taking: Reported on 02/18/2024)   No facility-administered encounter medications on file as of 02/18/2024.      Medical History: Past Medical History:  Diagnosis Date   Anxiety    Depression    Herpes labialis without complication      Vital Signs: BP (!) 126/90   Pulse 92   Temp (!) 96.6 F (35.9 C)   Ht 6' 4 (1.93 m)   Wt 190 lb (86.2 kg)   SpO2 98%   BMI 23.13 kg/m  Review of Systems  Constitutional:  Negative for chills, diaphoresis, fatigue and fever.  Respiratory:  Positive for chest tightness. Negative for shortness of breath.   Cardiovascular:  Positive for palpitations. Negative for chest pain and leg swelling.  Neurological:  Positive for dizziness (2 days ago, not currently) and headaches (yesterday, not currently). Negative for syncope, weakness and numbness.  Psychiatric/Behavioral:  The patient is nervous/anxious.     Physical Exam Vitals (BP rechecked 126/92, HR 88 seated) reviewed.  Constitutional:      General: He is  not in acute distress.    Appearance: Normal appearance. He is well-developed. He is not ill-appearing, toxic-appearing or diaphoretic.  HENT:     Head: Normocephalic and atraumatic.  Cardiovascular:     Rate and Rhythm: Normal rate and regular rhythm.     Pulses:          Radial pulses are 2+ on the right side and 2+ on the left side.     Heart sounds: No murmur heard.    No friction rub. No gallop.  Pulmonary:     Effort: Pulmonary effort is normal. No respiratory distress.     Breath sounds: Normal breath sounds.  Musculoskeletal:     Right lower leg: No edema.     Left lower leg: No edema.  Skin:    General: Skin is warm and dry.     Comments: Normal color  Neurological:     Mental Status: He is alert.     Motor: No tremor.     Gait: Gait is intact.  Psychiatric:        Attention and Perception: Attention normal.        Mood and Affect: Affect normal.    Last labs done Feb/June 2024 included normal CBC w/diff, Hg A1C, TSH and CMP.   Assessment/Plan: 1. Tachycardia, unspecified (Primary) Discussed reassuring vital signs and exam findings today. Discussed various causes for elevated HR including anxiety/stress, dehydration, illness, exercise/activity (though HR elevations noted by watch appear to have occurred during periods of rest), electrolyte imbalance, medications/other stimulants and arrhythmia.  Patient is well-appearing today and denies other concerning symptoms. Heart rate normal with regular rhythm. Discussed option to do EKG but elected to defer for now.  Discussed hydrating well next few days. Avoid alcohol and caffeine. Patient already utilizing coping strategies for his anxiety and meeting with therapist regularly. States stressful situation has improved from few weeks ago, though not yet full resolved. He is receiving support from coworkers, family and friends Encouraged to schedule appointment with PCP soon for physical exam.  Patient encouraged to return to  clinic with new/worsening/persistent symptoms (i.e. frequent palpitations, dizziness, feeling faint, chest pain or shortness of breath). Go to emergency department with severe or acutely worsening symptoms.    General Counseling: Doug verbalizes understanding of the findings of todays visit.  he has been encouraged to call the office with any questions or concerns that should arise related to todays visit.    Time spent:25 Minutes    Joen Arts PA-C Physician Assistant

## 2024-02-19 NOTE — Patient Instructions (Addendum)
-  Make sure you are getting enough rest. -Stay well hydrated. Avoid alcohol and caffeine the next few days. -Consider scheduling an appointment with your primary care provider for a physical soon. -Schedule follow up visit in meantime as needed for new/worsening/recurring symptoms (i.e. frequent fast or irregular heart beat, dizziness, feeling faint, chest pain or shortness of breath). Go to the emergency department if any of these symptoms are severe or quickly get worse. -Send me a message with any questions or concerns.

## 2024-02-22 DIAGNOSIS — F902 Attention-deficit hyperactivity disorder, combined type: Secondary | ICD-10-CM | POA: Diagnosis not present

## 2024-02-22 DIAGNOSIS — F4312 Post-traumatic stress disorder, chronic: Secondary | ICD-10-CM | POA: Diagnosis not present

## 2024-02-22 DIAGNOSIS — F422 Mixed obsessional thoughts and acts: Secondary | ICD-10-CM | POA: Diagnosis not present

## 2024-02-22 DIAGNOSIS — F603 Borderline personality disorder: Secondary | ICD-10-CM | POA: Diagnosis not present

## 2024-02-29 DIAGNOSIS — F422 Mixed obsessional thoughts and acts: Secondary | ICD-10-CM | POA: Diagnosis not present

## 2024-02-29 DIAGNOSIS — F603 Borderline personality disorder: Secondary | ICD-10-CM | POA: Diagnosis not present

## 2024-02-29 DIAGNOSIS — F4312 Post-traumatic stress disorder, chronic: Secondary | ICD-10-CM | POA: Diagnosis not present

## 2024-02-29 DIAGNOSIS — F902 Attention-deficit hyperactivity disorder, combined type: Secondary | ICD-10-CM | POA: Diagnosis not present

## 2024-03-07 DIAGNOSIS — F422 Mixed obsessional thoughts and acts: Secondary | ICD-10-CM | POA: Diagnosis not present

## 2024-03-07 DIAGNOSIS — F4312 Post-traumatic stress disorder, chronic: Secondary | ICD-10-CM | POA: Diagnosis not present

## 2024-03-07 DIAGNOSIS — F603 Borderline personality disorder: Secondary | ICD-10-CM | POA: Diagnosis not present

## 2024-03-07 DIAGNOSIS — F902 Attention-deficit hyperactivity disorder, combined type: Secondary | ICD-10-CM | POA: Diagnosis not present

## 2024-03-14 DIAGNOSIS — F4312 Post-traumatic stress disorder, chronic: Secondary | ICD-10-CM | POA: Diagnosis not present

## 2024-03-14 DIAGNOSIS — F902 Attention-deficit hyperactivity disorder, combined type: Secondary | ICD-10-CM | POA: Diagnosis not present

## 2024-03-14 DIAGNOSIS — F603 Borderline personality disorder: Secondary | ICD-10-CM | POA: Diagnosis not present

## 2024-03-14 DIAGNOSIS — F422 Mixed obsessional thoughts and acts: Secondary | ICD-10-CM | POA: Diagnosis not present

## 2024-03-18 ENCOUNTER — Encounter: Payer: Self-pay | Admitting: Internal Medicine

## 2024-03-18 ENCOUNTER — Ambulatory Visit: Admitting: Internal Medicine

## 2024-03-18 VITALS — BP 120/86 | HR 99 | Temp 97.8°F | Ht 76.0 in | Wt 195.2 lb

## 2024-03-18 DIAGNOSIS — R000 Tachycardia, unspecified: Secondary | ICD-10-CM | POA: Diagnosis not present

## 2024-03-18 DIAGNOSIS — Z113 Encounter for screening for infections with a predominantly sexual mode of transmission: Secondary | ICD-10-CM

## 2024-03-18 DIAGNOSIS — Z23 Encounter for immunization: Secondary | ICD-10-CM

## 2024-03-18 DIAGNOSIS — Z0001 Encounter for general adult medical examination with abnormal findings: Secondary | ICD-10-CM

## 2024-03-18 DIAGNOSIS — R7303 Prediabetes: Secondary | ICD-10-CM

## 2024-03-18 LAB — HEPATIC FUNCTION PANEL
ALT: 11 U/L (ref 0–53)
AST: 16 U/L (ref 0–37)
Albumin: 4.2 g/dL (ref 3.5–5.2)
Alkaline Phosphatase: 55 U/L (ref 39–117)
Bilirubin, Direct: 0.2 mg/dL (ref 0.0–0.3)
Total Bilirubin: 0.9 mg/dL (ref 0.2–1.2)
Total Protein: 7 g/dL (ref 6.0–8.3)

## 2024-03-18 LAB — URINALYSIS, ROUTINE W REFLEX MICROSCOPIC
Bilirubin Urine: NEGATIVE
Hgb urine dipstick: NEGATIVE
Ketones, ur: NEGATIVE
Leukocytes,Ua: NEGATIVE
Nitrite: NEGATIVE
Specific Gravity, Urine: 1.02 (ref 1.000–1.030)
Total Protein, Urine: NEGATIVE
Urine Glucose: NEGATIVE
Urobilinogen, UA: 0.2 (ref 0.0–1.0)
pH: 6 (ref 5.0–8.0)

## 2024-03-18 LAB — LIPID PANEL
Cholesterol: 137 mg/dL (ref 0–200)
HDL: 48.2 mg/dL (ref >39.00)
LDL Cholesterol: 73 mg/dL (ref 0–99)
NonHDL: 88.37
Total CHOL/HDL Ratio: 3
Triglycerides: 78 mg/dL (ref 0.0–149.0)
VLDL: 15.6 mg/dL (ref 0.0–40.0)

## 2024-03-18 LAB — BASIC METABOLIC PANEL WITH GFR
BUN: 13 mg/dL (ref 6–23)
CO2: 29 meq/L (ref 19–32)
Calcium: 9.1 mg/dL (ref 8.4–10.5)
Chloride: 101 meq/L (ref 96–112)
Creatinine, Ser: 0.97 mg/dL (ref 0.40–1.50)
GFR: 98.58 mL/min (ref >60.00)
Glucose, Bld: 103 mg/dL — ABNORMAL HIGH (ref 70–99)
Potassium: 4.6 meq/L (ref 3.5–5.1)
Sodium: 137 meq/L (ref 135–145)

## 2024-03-18 LAB — HEMOGLOBIN A1C: Hgb A1c MFr Bld: 5.5 % (ref 4.6–6.5)

## 2024-03-18 LAB — CBC WITH DIFFERENTIAL/PLATELET
Basophils Absolute: 0 K/uL (ref 0.0–0.1)
Basophils Relative: 0.5 % (ref 0.0–3.0)
Eosinophils Absolute: 0.1 K/uL (ref 0.0–0.7)
Eosinophils Relative: 2.4 % (ref 0.0–5.0)
HCT: 41.1 % (ref 39.0–52.0)
Hemoglobin: 13.9 g/dL (ref 13.0–17.0)
Lymphocytes Relative: 33.4 % (ref 12.0–46.0)
Lymphs Abs: 1.9 K/uL (ref 0.7–4.0)
MCHC: 33.7 g/dL (ref 30.0–36.0)
MCV: 93.3 fl (ref 78.0–100.0)
Monocytes Absolute: 0.5 K/uL (ref 0.1–1.0)
Monocytes Relative: 8.8 % (ref 3.0–12.0)
Neutro Abs: 3.1 K/uL (ref 1.4–7.7)
Neutrophils Relative %: 54.9 % (ref 43.0–77.0)
Platelets: 229 K/uL (ref 150.0–400.0)
RBC: 4.41 Mil/uL (ref 4.22–5.81)
RDW: 12.7 % (ref 11.5–15.5)
WBC: 5.6 K/uL (ref 4.0–10.5)

## 2024-03-18 LAB — TSH: TSH: 0.61 u[IU]/mL (ref 0.35–5.50)

## 2024-03-18 NOTE — Patient Instructions (Signed)
 Health Maintenance, Male  Adopting a healthy lifestyle and getting preventive care are important in promoting health and wellness. Ask your health care provider about:  The right schedule for you to have regular tests and exams.  Things you can do on your own to prevent diseases and keep yourself healthy.  What should I know about diet, weight, and exercise?  Eat a healthy diet    Eat a diet that includes plenty of vegetables, fruits, low-fat dairy products, and lean protein.  Do not eat a lot of foods that are high in solid fats, added sugars, or sodium.  Maintain a healthy weight  Body mass index (BMI) is a measurement that can be used to identify possible weight problems. It estimates body fat based on height and weight. Your health care provider can help determine your BMI and help you achieve or maintain a healthy weight.  Get regular exercise  Get regular exercise. This is one of the most important things you can do for your health. Most adults should:  Exercise for at least 150 minutes each week. The exercise should increase your heart rate and make you sweat (moderate-intensity exercise).  Do strengthening exercises at least twice a week. This is in addition to the moderate-intensity exercise.  Spend less time sitting. Even light physical activity can be beneficial.  Watch cholesterol and blood lipids  Have your blood tested for lipids and cholesterol at 39 years of age, then have this test every 5 years.  You may need to have your cholesterol levels checked more often if:  Your lipid or cholesterol levels are high.  You are older than 39 years of age.  You are at high risk for heart disease.  What should I know about cancer screening?  Many types of cancers can be detected early and may often be prevented. Depending on your health history and family history, you may need to have cancer screening at various ages. This may include screening for:  Colorectal cancer.  Prostate cancer.  Skin cancer.  Lung  cancer.  What should I know about heart disease, diabetes, and high blood pressure?  Blood pressure and heart disease  High blood pressure causes heart disease and increases the risk of stroke. This is more likely to develop in people who have high blood pressure readings or are overweight.  Talk with your health care provider about your target blood pressure readings.  Have your blood pressure checked:  Every 3-5 years if you are 39-52 years of age.  Every year if you are 39 years old or older.  If you are between the ages of 60 and 72 and are a current or former smoker, ask your health care provider if you should have a one-time screening for abdominal aortic aneurysm (AAA).  Diabetes  Have regular diabetes screenings. This checks your fasting blood sugar level. Have the screening done:  Once every three years after age 39 if you are at a normal weight and have a low risk for diabetes.  More often and at a younger age if you are overweight or have a high risk for diabetes.  What should I know about preventing infection?  Hepatitis B  If you have a higher risk for hepatitis B, you should be screened for this virus. Talk with your health care provider to find out if you are at risk for hepatitis B infection.  Hepatitis C  Blood testing is recommended for:  Everyone born from [birth year] through 1966.  Anyone  with known risk factors for hepatitis C.  Sexually transmitted infections (STIs)  You should be screened each year for STIs, including gonorrhea and chlamydia, if:  You are sexually active and are younger than 39 years of age.  You are older than 39 years of age and your health care provider tells you that you are at risk for this type of infection.  Your sexual activity has changed since you were last screened, and you are at increased risk for chlamydia or gonorrhea. Ask your health care provider if you are at risk.  Ask your health care provider about whether you are at high risk for HIV. Your health care provider  may recommend a prescription medicine to help prevent HIV infection. If you choose to take medicine to prevent HIV, you should first get tested for HIV. You should then be tested every 3 months for as long as you are taking the medicine.  Follow these instructions at home:  Alcohol use  Do not drink alcohol if your health care provider tells you not to drink.  If you drink alcohol:  Limit how much you have to 0-2 drinks a day.  Know how much alcohol is in your drink. In the [place] one drink equals one 12 oz bottle of beer (355 mL), one 5 oz glass of wine (148 mL), or one 1 oz glass of hard liquor (44 mL).  Lifestyle  Do not use any products that contain nicotine or tobacco. These products include cigarettes, chewing tobacco, and vaping devices, such as e-cigarettes. If you need help quitting, ask your health care provider.  Do not use street drugs.  Do not share needles.  Ask your health care provider for help if you need support or information about quitting drugs.  General instructions  Schedule regular health, dental, and eye exams.  Stay current with your vaccines.  Tell your health care provider if:  You often feel depressed.  You have ever been abused or do not feel safe at home.  Summary  Adopting a healthy lifestyle and getting preventive care are important in promoting health and wellness.  Follow your health care provider's instructions about healthy diet, exercising, and getting tested or screened for diseases.  Follow your health care provider's instructions on monitoring your cholesterol and blood pressure.  This information is not intended to replace advice given to you by your health care provider. Make sure you discuss any questions you have with your health care provider.  Document Revised: 11/15/2020 Document Reviewed: 11/15/2020  Elsevier Patient Education  2024 [employer].

## 2024-03-18 NOTE — Progress Notes (Unsigned)
 Subjective:  Patient ID: Zachary Becker, male    DOB: 1986-01-22  Age: 38 y.o. MRN: [id number]  CC: Annual Exam   HPI Zachary Becker presents for a CPX and f/up ---  Discussed the use of AI scribe software for clinical note transcription with the patient, who gave verbal consent to proceed.  History of Present Illness Zachary Becker is a 38 year old male who presents with episodes of elevated heart rate.  He has been experiencing episodes of elevated heart rate since a stressful period in late July and August, with his watch indicating rates of 115-120 bpm while at rest. He attributes some of this to anxiety, feeling more conscious of his heart rate during these episodes. No dizziness, lightheadedness, chest pain, or shortness of breath, although he mentions anxiety might have caused him to perceive chest pain.  He resumed nicotine use in May, which he identifies as a potential factor contributing to his elevated heart rate. He has been using a nicotine patch for four days in an attempt to quit again, noting that he felt less conscious of his heart rate when not using nicotine. He also takes Vyvanse  most days, a medication he has been on for two to three years, but recently resumed taking it daily after a period of irregular use. He does not believe Vyvanse  is contributing to his symptoms as he did not experience elevated heart rate previously while on the medication.  He mentions a recent weight loss of about 15 pounds, which he attributes to changes in his medication regimen, including discontinuation of an antipsychotic. No abdominal pain, testicular pain, or swelling.  He requests an STI screening for peace of mind, noting that he has no symptoms such as sore throat, swollen lymph nodes, rashes, painful urination, or discharge.  He reports a painful spot on the bottom of one foot, which he suspects may be due to having stepped on something earlier in the  summer. He has a dermatology appointment scheduled for next month to address this and a precancerous lesion on his nose.     Outpatient Medications Prior to Visit  Medication Sig Dispense Refill   DAYVIGO 5 MG TABS Take 1 tablet by mouth at bedtime.     DESCOVY  200-25 MG tablet TAKE 1 TABLET BY MOUTH DAILY 90 tablet 0   lisdexamfetamine (VYVANSE ) 20 MG capsule Take 20 mg by mouth daily.     valACYclovir  (VALTREX ) 1000 MG tablet Take 1 tablet (1,000 mg total) by mouth 3 (three) times daily. 15 tablet 1   valACYclovir  (VALTREX ) 500 MG tablet Take 500 mg by mouth daily.     No facility-administered medications prior to visit.    ROS Review of Systems  Constitutional:  Negative for appetite change, chills, diaphoresis, fatigue and fever.  HENT: Negative.  Negative for sore throat and trouble swallowing.   Eyes: Negative.   Respiratory:  Negative for cough, chest tightness, shortness of breath and wheezing.   Cardiovascular:  Positive for palpitations. Negative for chest pain and leg swelling.  Gastrointestinal:  Negative for abdominal pain, constipation, diarrhea, nausea and vomiting.  Endocrine: Negative.   Genitourinary: Negative.  Negative for difficulty urinating, dysuria, penile swelling, scrotal swelling and testicular pain.  Musculoskeletal: Negative.   Skin:  Negative for color change and rash.  Neurological: Negative.  Negative for dizziness, weakness and light-headedness.  Hematological:  Negative for adenopathy. Does not bruise/bleed easily.  Psychiatric/Behavioral:  Positive for decreased concentration. Negative  for confusion, self-injury, sleep disturbance and suicidal ideas. The patient is nervous/anxious. The patient is not hyperactive.     Objective:  BP 120/86 (BP Location: Left Arm, Patient Position: Sitting, Cuff Size: Normal)   Pulse 99   Temp 97.8 F (36.6 C) (Oral)   Ht 6' 4 (1.93 m)   Wt 195 lb 3.2 oz (88.5 kg)   SpO2 96%   BMI 23.76 kg/m   BP Readings  from Last 3 Encounters:  03/18/24 120/86  02/18/24 (!) 126/90  12/28/23 (!) 126/92    Wt Readings from Last 3 Encounters:  03/18/24 195 lb 3.2 oz (88.5 kg)  02/18/24 190 lb (86.2 kg)  11/29/23 195 lb (88.5 kg)    Physical Exam Vitals reviewed.  Constitutional:      Appearance: Normal appearance.  HENT:     Mouth/Throat:     Mouth: Mucous membranes are moist.  Eyes:     General: No scleral icterus.    Conjunctiva/sclera: Conjunctivae normal.  Cardiovascular:     Rate and Rhythm: Normal rate.     Heart sounds: Normal heart sounds, S1 normal and S2 normal. No murmur heard.    No friction rub. No gallop.     Comments: EKG--- NSR, 98 bpm No LVH, Q waves, or ST/T wave changes  Pulmonary:     Effort: Pulmonary effort is normal.     Breath sounds: No stridor. No wheezing, rhonchi or rales.  Abdominal:     General: Abdomen is flat.     Palpations: There is no mass.     Tenderness: There is no abdominal tenderness. There is no guarding.     Hernia: No hernia is present.  Musculoskeletal:        General: Normal range of motion.     Cervical back: Neck supple.     Right lower leg: No edema.     Left lower leg: No edema.  Skin:    General: Skin is warm and dry.     Findings: No rash.  Neurological:     General: No focal deficit present.     Mental Status: He is alert.  Psychiatric:        Mood and Affect: Mood normal.        Behavior: Behavior normal.     Lab Results  Component Value Date   WBC 8.3 08/10/2022   HGB 14.3 08/10/2022   HCT 41.3 08/10/2022   PLT 263.0 08/10/2022   GLUCOSE 109 (H) 12/19/2022   CHOL 145 06/29/2021   TRIG 73 06/29/2021   HDL 44 06/29/2021   LDLCALC 87 06/29/2021   ALT 22 12/19/2022   AST 23 12/19/2022   NA 137 12/19/2022   K 4.9 12/19/2022   CL 101 12/19/2022   CREATININE 1.14 12/19/2022   BUN 15 12/19/2022   CO2 24 12/19/2022   TSH 1.640 12/19/2022   INR 1.1 (H) 05/18/2020   HGBA1C 5.3 08/10/2022    No results  found.  Assessment & Plan:   Tachycardia- HR is mildly elevated. This is caused by the use of vyvanse . -     EKG 12-Lead -     TSH; Future -     Urinalysis, Routine w reflex microscopic; Future -     Hepatic function panel; Future -     Basic metabolic panel with GFR; Future -     CBC with Differential/Platelet; Future  Immunization due -     Tdap vaccine greater than or equal to 7yo IM  Screening for STD (sexually transmitted disease) -     RPR; Future -     HIV Antibody (routine testing w rflx); Future  Prediabetes -     Urinalysis, Routine w reflex microscopic; Future -     Hemoglobin A1c; Future -     Basic metabolic panel with GFR; Future  Encounter for general adult medical examination with abnormal findings- Exam completed, labs reviewed, vaccines reviewed and updated, no cancer screenings indicated, pt ed material was given.  -     Lipid panel; Future     Follow-up: Return in about 6 months (around 09/15/2024).  Debby Molt, MD

## 2024-03-19 ENCOUNTER — Encounter: Payer: Self-pay | Admitting: Internal Medicine

## 2024-03-20 LAB — HIV ANTIBODY (ROUTINE TESTING W REFLEX)
HIV 1&2 Ab, 4th Generation: NONREACTIVE
HIV FINAL INTERPRETATION: NEGATIVE

## 2024-03-20 LAB — RPR: RPR Ser Ql: NONREACTIVE

## 2024-03-21 ENCOUNTER — Ambulatory Visit: Payer: Self-pay | Admitting: Internal Medicine

## 2024-03-21 ENCOUNTER — Encounter: Payer: Self-pay | Admitting: Adult Health

## 2024-03-21 DIAGNOSIS — F422 Mixed obsessional thoughts and acts: Secondary | ICD-10-CM | POA: Diagnosis not present

## 2024-03-21 DIAGNOSIS — F902 Attention-deficit hyperactivity disorder, combined type: Secondary | ICD-10-CM | POA: Diagnosis not present

## 2024-03-21 DIAGNOSIS — F603 Borderline personality disorder: Secondary | ICD-10-CM | POA: Diagnosis not present

## 2024-03-21 DIAGNOSIS — F4312 Post-traumatic stress disorder, chronic: Secondary | ICD-10-CM | POA: Diagnosis not present

## 2024-03-27 ENCOUNTER — Other Ambulatory Visit: Payer: Self-pay

## 2024-03-27 DIAGNOSIS — Z113 Encounter for screening for infections with a predominantly sexual mode of transmission: Secondary | ICD-10-CM

## 2024-03-28 ENCOUNTER — Other Ambulatory Visit: Payer: Self-pay | Admitting: Internal Medicine

## 2024-03-28 DIAGNOSIS — F902 Attention-deficit hyperactivity disorder, combined type: Secondary | ICD-10-CM | POA: Diagnosis not present

## 2024-03-28 DIAGNOSIS — F4312 Post-traumatic stress disorder, chronic: Secondary | ICD-10-CM | POA: Diagnosis not present

## 2024-03-28 DIAGNOSIS — F603 Borderline personality disorder: Secondary | ICD-10-CM | POA: Diagnosis not present

## 2024-03-28 DIAGNOSIS — F422 Mixed obsessional thoughts and acts: Secondary | ICD-10-CM | POA: Diagnosis not present

## 2024-03-29 ENCOUNTER — Other Ambulatory Visit: Payer: Self-pay | Admitting: Internal Medicine

## 2024-03-31 ENCOUNTER — Other Ambulatory Visit: Payer: Self-pay | Admitting: Internal Medicine

## 2024-03-31 ENCOUNTER — Ambulatory Visit: Payer: Self-pay | Admitting: Adult Health

## 2024-03-31 LAB — CHLAMYDIA/GONOCOCCUS/TRICHOMONAS, NAA
Chlamydia by NAA: NEGATIVE
Gonococcus by NAA: NEGATIVE
Trich vag by NAA: NEGATIVE

## 2024-03-31 LAB — CT/GC NAA, PHARYNGEAL
C TRACH RRNA NPH QL PCR: NEGATIVE
N GONORRHOEA RRNA NPH QL PCR: NEGATIVE

## 2024-04-04 DIAGNOSIS — F902 Attention-deficit hyperactivity disorder, combined type: Secondary | ICD-10-CM | POA: Diagnosis not present

## 2024-04-04 DIAGNOSIS — F4312 Post-traumatic stress disorder, chronic: Secondary | ICD-10-CM | POA: Diagnosis not present

## 2024-04-04 DIAGNOSIS — F422 Mixed obsessional thoughts and acts: Secondary | ICD-10-CM | POA: Diagnosis not present

## 2024-04-04 DIAGNOSIS — F603 Borderline personality disorder: Secondary | ICD-10-CM | POA: Diagnosis not present

## 2024-04-08 DIAGNOSIS — F331 Major depressive disorder, recurrent, moderate: Secondary | ICD-10-CM | POA: Diagnosis not present

## 2024-04-08 DIAGNOSIS — F902 Attention-deficit hyperactivity disorder, combined type: Secondary | ICD-10-CM | POA: Diagnosis not present

## 2024-04-08 DIAGNOSIS — F411 Generalized anxiety disorder: Secondary | ICD-10-CM | POA: Diagnosis not present

## 2024-04-08 DIAGNOSIS — F603 Borderline personality disorder: Secondary | ICD-10-CM | POA: Diagnosis not present

## 2024-04-10 DIAGNOSIS — B353 Tinea pedis: Secondary | ICD-10-CM | POA: Diagnosis not present

## 2024-04-10 DIAGNOSIS — L570 Actinic keratosis: Secondary | ICD-10-CM | POA: Diagnosis not present

## 2024-04-10 DIAGNOSIS — R238 Other skin changes: Secondary | ICD-10-CM | POA: Diagnosis not present

## 2024-04-10 DIAGNOSIS — B078 Other viral warts: Secondary | ICD-10-CM | POA: Diagnosis not present

## 2024-04-18 DIAGNOSIS — F4312 Post-traumatic stress disorder, chronic: Secondary | ICD-10-CM | POA: Diagnosis not present

## 2024-04-18 DIAGNOSIS — F902 Attention-deficit hyperactivity disorder, combined type: Secondary | ICD-10-CM | POA: Diagnosis not present

## 2024-04-18 DIAGNOSIS — F603 Borderline personality disorder: Secondary | ICD-10-CM | POA: Diagnosis not present

## 2024-04-18 DIAGNOSIS — F422 Mixed obsessional thoughts and acts: Secondary | ICD-10-CM | POA: Diagnosis not present

## 2024-04-23 ENCOUNTER — Ambulatory Visit (INDEPENDENT_AMBULATORY_CARE_PROVIDER_SITE_OTHER): Payer: Self-pay | Admitting: Adult Health

## 2024-04-23 ENCOUNTER — Other Ambulatory Visit: Payer: Self-pay | Admitting: Adult Health

## 2024-04-23 DIAGNOSIS — R509 Fever, unspecified: Secondary | ICD-10-CM

## 2024-04-23 DIAGNOSIS — Z209 Contact with and (suspected) exposure to unspecified communicable disease: Secondary | ICD-10-CM

## 2024-04-23 DIAGNOSIS — Z2981 Encounter for HIV pre-exposure prophylaxis: Secondary | ICD-10-CM

## 2024-04-23 NOTE — Progress Notes (Signed)
 Virtual Visit Consent   Zachary Becker, you are scheduled for a virtual visit with a [facility] provider today. Just as with appointments in the office, your consent must be obtained to participate.   I need to obtain your verbal consent now. Are you willing to proceed with your visit today? Zachary Becker has provided verbal consent on 04/23/2024 for a virtual visit (video or telephone). Juliene JINNY Howells, NP  Date: 04/23/2024 10:11 AM  Virtual Visit via Video Note   I, Juliene JINNY Howells, connected with  Zachary Becker  ([id number], 05/18/1986) on 04/23/24 at 10:00 AM EDT by telephone and verified that I am speaking with the correct person using two identifiers.  Location: Patient: Virtual Visit Location Patient: Home Provider: Virtual Visit Location Provider: Office/Clinic   I discussed the limitations of evaluation and management by telemedicine and the availability of in person appointments. The patient expressed understanding and agreed to proceed.    History of Present Illness: Zachary Becker is a 38 y.o. and is being seen today reporting last night he had a fever 102, which had been present for 2 days.  He has taken some covid tests, and they have been negative.  He reports his fever broke overnight and he feels improvement.  He also had chills, sore throat. Denies coughing or congestion, NVD.  He was intermittently taking Tylenol and Ibuprofen.   HPI: HPI  Problems:  Patient Active Problem List   Diagnosis Date Noted   Encounter for general adult medical examination with abnormal findings 03/18/2024   Immunization due 03/18/2024   Tachycardia 03/18/2024   Borderline personality disorder (HCC) 01/15/2023   Cannabis use, unspecified, uncomplicated 01/15/2023   Encounter for HIV pre-exposure prophylaxis 08/10/2022   Prediabetes 08/10/2022   Mild recurrent major depression 12/09/2021   Screening for STD (sexually transmitted disease) 01/21/2021    Benign nevus without atypia 11/07/2017   Generalized anxiety disorder 08/22/2017   Tobacco abuse disorder 10/31/2016   Herpes labialis without complication 08/22/2016    Allergies: No Known Allergies Medications:  Current Outpatient Medications:    DAYVIGO 5 MG TABS, Take 1 tablet by mouth at bedtime., Disp: , Rfl:    DESCOVY  200-25 MG tablet, TAKE 1 TABLET BY MOUTH DAILY, Disp: 90 tablet, Rfl: 0   lisdexamfetamine (VYVANSE ) 20 MG capsule, Take 20 mg by mouth daily., Disp: , Rfl:    valACYclovir  (VALTREX ) 1000 MG tablet, Take 1 tablet (1,000 mg total) by mouth 3 (three) times daily., Disp: 15 tablet, Rfl: 1   valACYclovir  (VALTREX ) 500 MG tablet, Take 500 mg by mouth daily., Disp: , Rfl:   Observations/Objective: No labored breathing.  Speech is clear and coherent with logical content.  Patient is alert and oriented at baseline.    Assessment and Plan: 1. Fever, unspecified fever cause (Primary) Resolving, likely viral.  Continue OTC meds for symptoms relief.  Stay hydrated.  Follow up via MyChart messenger if symptoms fail to improve or may return to clinic as needed for worsening symptoms.     Follow Up Instructions: I discussed the assessment and treatment plan with the patient. The patient was provided an opportunity to ask questions and all were answered. The patient agreed with the plan and demonstrated an understanding of the instructions.    The patient was advised to call back or seek an in-person evaluation if the symptoms worsen or if the condition fails to improve as anticipated.  Time:  I spent 10 minutes  with the patient via telehealth technology discussing the above problems/concerns.    Juliene JINNY Howells, NP

## 2024-04-25 DIAGNOSIS — F603 Borderline personality disorder: Secondary | ICD-10-CM | POA: Diagnosis not present

## 2024-04-25 DIAGNOSIS — F902 Attention-deficit hyperactivity disorder, combined type: Secondary | ICD-10-CM | POA: Diagnosis not present

## 2024-04-25 DIAGNOSIS — F4312 Post-traumatic stress disorder, chronic: Secondary | ICD-10-CM | POA: Diagnosis not present

## 2024-04-25 DIAGNOSIS — F422 Mixed obsessional thoughts and acts: Secondary | ICD-10-CM | POA: Diagnosis not present

## 2024-05-02 DIAGNOSIS — F4312 Post-traumatic stress disorder, chronic: Secondary | ICD-10-CM | POA: Diagnosis not present

## 2024-05-02 DIAGNOSIS — F603 Borderline personality disorder: Secondary | ICD-10-CM | POA: Diagnosis not present

## 2024-05-02 DIAGNOSIS — F902 Attention-deficit hyperactivity disorder, combined type: Secondary | ICD-10-CM | POA: Diagnosis not present

## 2024-05-02 DIAGNOSIS — F422 Mixed obsessional thoughts and acts: Secondary | ICD-10-CM | POA: Diagnosis not present

## 2024-05-05 ENCOUNTER — Encounter: Payer: Self-pay | Admitting: Family Medicine

## 2024-05-05 ENCOUNTER — Ambulatory Visit: Admitting: Family Medicine

## 2024-05-05 ENCOUNTER — Ambulatory Visit: Payer: Self-pay | Admitting: Medical

## 2024-05-05 VITALS — BP 124/84 | HR 69 | Temp 98.1°F | Resp 18 | Ht 76.0 in | Wt 194.0 lb

## 2024-05-05 DIAGNOSIS — B9689 Other specified bacterial agents as the cause of diseases classified elsewhere: Secondary | ICD-10-CM

## 2024-05-05 DIAGNOSIS — J988 Other specified respiratory disorders: Secondary | ICD-10-CM | POA: Diagnosis not present

## 2024-05-05 MED ORDER — AMOXICILLIN-POT CLAVULANATE 875-125 MG PO TABS: 1.0000 | ORAL_TABLET | Freq: Two times a day (BID) | ORAL | 0 refills | Status: AC

## 2024-05-05 MED ORDER — PREDNISONE 10 MG (21) PO TBPK: ORAL_TABLET | ORAL | 0 refills | Status: DC

## 2024-05-05 NOTE — Progress Notes (Signed)
 Assessment & Plan Bacterial respiratory infection  Orders:   amoxicillin -clavulanate (AUGMENTIN ) 875-125 MG tablet; Take 1 tablet by mouth 2 (two) times daily for 7 days.   predniSONE (STERAPRED UNI-PAK 21 TAB) 10 MG (21) TBPK tablet; Use as directed.   Follow up plan: Return if symptoms worsen or fail to improve.  Niki Rung, MSN, APRN, FNP-C  Subjective:  HPI: Zachary Becker is a 38 y.o. male presenting on 05/05/2024 for Cough (Sick for 2 weeks /Productive deep Cough,congestion - yellowish, ST for a few days, just feels weak and tired /Fever at the beginning - none recently /)  Discussed the use of AI scribe software for clinical note transcription with the patient, who gave verbal consent to proceed.  He has been experiencing a persistent cough for the past two weeks. Initially, the cough was productive with yellow sputum, particularly more so on Thursday, Friday, and Saturday of the previous week. The cough feels more in his chest or lungs rather than just postnasal drip or throat congestion. The cough has been accompanied by a high fever of 102F, chills, fatigue, and intermittent rhinorrhea. The fever has since resolved, but he continues to feel unwell, spending a day on the couch due to fatigue.  He has been managing his symptoms with over-the-counter medications including Mucinex, ibuprofen, and Tylenol. He is concerned about the duration of his illness, as he is not typically sick for this long, even when he had COVID in the past.  In terms of social history, he has a history of smoking and has been vaping on and off since May. He is currently attempting to quit vaping and has been using nicotine patches frequently. No history of asthma.       ROS: Negative unless specifically indicated above in HPI.   Relevant past medical history reviewed and updated as indicated.   Allergies and medications reviewed and updated.   Current Outpatient Medications:     amoxicillin -clavulanate (AUGMENTIN ) 875-125 MG tablet, Take 1 tablet by mouth 2 (two) times daily for 7 days., Disp: 14 tablet, Rfl: 0   DAYVIGO 5 MG TABS, Take 1 tablet by mouth at bedtime., Disp: , Rfl:    DESCOVY  200-25 MG tablet, TAKE 1 TABLET BY MOUTH DAILY, Disp: 90 tablet, Rfl: 0   lisdexamfetamine (VYVANSE ) 20 MG capsule, Take 20 mg by mouth daily., Disp: , Rfl:    predniSONE (STERAPRED UNI-PAK 21 TAB) 10 MG (21) TBPK tablet, Use as directed., Disp: 21 each, Rfl: 0   valACYclovir  (VALTREX ) 500 MG tablet, Take 500 mg by mouth daily., Disp: , Rfl:    valACYclovir  (VALTREX ) 1000 MG tablet, Take 1 tablet (1,000 mg total) by mouth 3 (three) times daily., Disp: 15 tablet, Rfl: 1  No Known Allergies  Objective:   BP 124/84   Pulse 69   Temp 98.1 F (36.7 C)   Resp 18   Ht 6' 4 (1.93 m)   Wt 194 lb (88 kg)   SpO2 98%   BMI 23.61 kg/m    Physical Exam Vitals reviewed.  Constitutional:      General: He is not in acute distress.    Appearance: Normal appearance. He is not ill-appearing, toxic-appearing or diaphoretic.  HENT:     Head: Normocephalic and atraumatic.     Right Ear: Tympanic membrane, ear canal and external ear normal. There is no impacted cerumen.     Left Ear: Tympanic membrane, ear canal and external ear normal. There is no impacted cerumen.  Nose:     Right Sinus: Frontal sinus tenderness present. No maxillary sinus tenderness.     Left Sinus: Frontal sinus tenderness present. No maxillary sinus tenderness.     Mouth/Throat:     Mouth: Mucous membranes are moist.     Pharynx: Oropharynx is clear. No oropharyngeal exudate or posterior oropharyngeal erythema.     Tonsils: No tonsillar exudate or tonsillar abscesses.  Eyes:     General: No scleral icterus.       Right eye: No discharge.        Left eye: No discharge.     Conjunctiva/sclera: Conjunctivae normal.  Cardiovascular:     Rate and Rhythm: Normal rate.  Pulmonary:     Effort: Pulmonary effort is  normal. No respiratory distress.  Musculoskeletal:        General: Normal range of motion.     Cervical back: Normal range of motion.  Lymphadenopathy:     Cervical: Cervical adenopathy present.  Skin:    General: Skin is warm and dry.  Neurological:     Mental Status: He is alert and oriented to person, place, and time. Mental status is at baseline.  Psychiatric:        Mood and Affect: Mood normal.        Behavior: Behavior normal.        Thought Content: Thought content normal.        Judgment: Judgment normal.

## 2024-05-09 DIAGNOSIS — F4312 Post-traumatic stress disorder, chronic: Secondary | ICD-10-CM | POA: Diagnosis not present

## 2024-05-09 DIAGNOSIS — F603 Borderline personality disorder: Secondary | ICD-10-CM | POA: Diagnosis not present

## 2024-05-09 DIAGNOSIS — F422 Mixed obsessional thoughts and acts: Secondary | ICD-10-CM | POA: Diagnosis not present

## 2024-05-09 DIAGNOSIS — F902 Attention-deficit hyperactivity disorder, combined type: Secondary | ICD-10-CM | POA: Diagnosis not present

## 2024-05-23 DIAGNOSIS — F902 Attention-deficit hyperactivity disorder, combined type: Secondary | ICD-10-CM | POA: Diagnosis not present

## 2024-05-23 DIAGNOSIS — F422 Mixed obsessional thoughts and acts: Secondary | ICD-10-CM | POA: Diagnosis not present

## 2024-05-23 DIAGNOSIS — F4312 Post-traumatic stress disorder, chronic: Secondary | ICD-10-CM | POA: Diagnosis not present

## 2024-05-23 DIAGNOSIS — F603 Borderline personality disorder: Secondary | ICD-10-CM | POA: Diagnosis not present

## 2024-05-25 DIAGNOSIS — F17298 Nicotine dependence, other tobacco product, with other nicotine-induced disorders: Secondary | ICD-10-CM | POA: Diagnosis not present

## 2024-05-30 DIAGNOSIS — F603 Borderline personality disorder: Secondary | ICD-10-CM | POA: Diagnosis not present

## 2024-05-30 DIAGNOSIS — F902 Attention-deficit hyperactivity disorder, combined type: Secondary | ICD-10-CM | POA: Diagnosis not present

## 2024-05-30 DIAGNOSIS — F4312 Post-traumatic stress disorder, chronic: Secondary | ICD-10-CM | POA: Diagnosis not present

## 2024-05-30 DIAGNOSIS — F422 Mixed obsessional thoughts and acts: Secondary | ICD-10-CM | POA: Diagnosis not present

## 2024-06-06 DIAGNOSIS — F4312 Post-traumatic stress disorder, chronic: Secondary | ICD-10-CM | POA: Diagnosis not present

## 2024-06-06 DIAGNOSIS — F603 Borderline personality disorder: Secondary | ICD-10-CM | POA: Diagnosis not present

## 2024-06-06 DIAGNOSIS — F902 Attention-deficit hyperactivity disorder, combined type: Secondary | ICD-10-CM | POA: Diagnosis not present

## 2024-06-06 DIAGNOSIS — F422 Mixed obsessional thoughts and acts: Secondary | ICD-10-CM | POA: Diagnosis not present

## 2024-06-09 DIAGNOSIS — F603 Borderline personality disorder: Secondary | ICD-10-CM | POA: Diagnosis not present

## 2024-06-09 DIAGNOSIS — F411 Generalized anxiety disorder: Secondary | ICD-10-CM | POA: Diagnosis not present

## 2024-06-09 DIAGNOSIS — F331 Major depressive disorder, recurrent, moderate: Secondary | ICD-10-CM | POA: Diagnosis not present

## 2024-06-09 DIAGNOSIS — F902 Attention-deficit hyperactivity disorder, combined type: Secondary | ICD-10-CM | POA: Diagnosis not present

## 2024-06-13 DIAGNOSIS — F422 Mixed obsessional thoughts and acts: Secondary | ICD-10-CM | POA: Diagnosis not present

## 2024-06-13 DIAGNOSIS — F4312 Post-traumatic stress disorder, chronic: Secondary | ICD-10-CM | POA: Diagnosis not present

## 2024-06-13 DIAGNOSIS — F603 Borderline personality disorder: Secondary | ICD-10-CM | POA: Diagnosis not present

## 2024-06-13 DIAGNOSIS — F902 Attention-deficit hyperactivity disorder, combined type: Secondary | ICD-10-CM | POA: Diagnosis not present

## 2024-06-20 DIAGNOSIS — F4312 Post-traumatic stress disorder, chronic: Secondary | ICD-10-CM | POA: Diagnosis not present

## 2024-06-20 DIAGNOSIS — F422 Mixed obsessional thoughts and acts: Secondary | ICD-10-CM | POA: Diagnosis not present

## 2024-06-20 DIAGNOSIS — F603 Borderline personality disorder: Secondary | ICD-10-CM | POA: Diagnosis not present

## 2024-06-20 DIAGNOSIS — F902 Attention-deficit hyperactivity disorder, combined type: Secondary | ICD-10-CM | POA: Diagnosis not present

## 2024-06-25 DIAGNOSIS — F902 Attention-deficit hyperactivity disorder, combined type: Secondary | ICD-10-CM | POA: Diagnosis not present

## 2024-06-25 DIAGNOSIS — F422 Mixed obsessional thoughts and acts: Secondary | ICD-10-CM | POA: Diagnosis not present

## 2024-06-25 DIAGNOSIS — F4312 Post-traumatic stress disorder, chronic: Secondary | ICD-10-CM | POA: Diagnosis not present

## 2024-06-25 DIAGNOSIS — F603 Borderline personality disorder: Secondary | ICD-10-CM | POA: Diagnosis not present

## 2024-06-30 DIAGNOSIS — F331 Major depressive disorder, recurrent, moderate: Secondary | ICD-10-CM | POA: Diagnosis not present

## 2024-06-30 DIAGNOSIS — F603 Borderline personality disorder: Secondary | ICD-10-CM | POA: Diagnosis not present

## 2024-06-30 DIAGNOSIS — F902 Attention-deficit hyperactivity disorder, combined type: Secondary | ICD-10-CM | POA: Diagnosis not present

## 2024-06-30 DIAGNOSIS — F411 Generalized anxiety disorder: Secondary | ICD-10-CM | POA: Diagnosis not present

## 2024-07-04 DIAGNOSIS — F902 Attention-deficit hyperactivity disorder, combined type: Secondary | ICD-10-CM | POA: Diagnosis not present

## 2024-07-04 DIAGNOSIS — F603 Borderline personality disorder: Secondary | ICD-10-CM | POA: Diagnosis not present

## 2024-07-04 DIAGNOSIS — F4312 Post-traumatic stress disorder, chronic: Secondary | ICD-10-CM | POA: Diagnosis not present

## 2024-07-04 DIAGNOSIS — F422 Mixed obsessional thoughts and acts: Secondary | ICD-10-CM | POA: Diagnosis not present

## 2024-07-29 ENCOUNTER — Other Ambulatory Visit: Payer: Self-pay | Admitting: Medical

## 2024-07-29 ENCOUNTER — Other Ambulatory Visit: Payer: Self-pay

## 2024-07-29 DIAGNOSIS — Z2981 Encounter for HIV pre-exposure prophylaxis: Secondary | ICD-10-CM

## 2024-07-29 DIAGNOSIS — Z113 Encounter for screening for infections with a predominantly sexual mode of transmission: Secondary | ICD-10-CM

## 2024-07-29 DIAGNOSIS — Z79899 Other long term (current) drug therapy: Secondary | ICD-10-CM

## 2024-07-29 DIAGNOSIS — Z7251 High risk heterosexual behavior: Secondary | ICD-10-CM

## 2024-07-29 LAB — POCT URINALYSIS DIPSTICK (MANUAL)
Leukocytes, UA: NEGATIVE
Nitrite, UA: NEGATIVE
Poct Bilirubin: NEGATIVE
Poct Glucose: NORMAL mg/dL
Poct Ketones: NEGATIVE
Poct Protein: NEGATIVE mg/dL
Poct Urobilinogen: NORMAL mg/dL
Spec Grav, UA: 1.01
pH, UA: 6.5

## 2024-07-29 NOTE — Progress Notes (Signed)
 Patient requesting STI screening. Takes medication for HIV pre-exposure prophylaxis.

## 2024-07-30 ENCOUNTER — Ambulatory Visit: Payer: Self-pay | Admitting: Medical

## 2024-07-30 DIAGNOSIS — R739 Hyperglycemia, unspecified: Secondary | ICD-10-CM

## 2024-07-30 DIAGNOSIS — Z79899 Other long term (current) drug therapy: Secondary | ICD-10-CM

## 2024-07-30 DIAGNOSIS — Z131 Encounter for screening for diabetes mellitus: Secondary | ICD-10-CM

## 2024-07-30 LAB — COMPREHENSIVE METABOLIC PANEL WITH GFR
ALT: 15 IU/L (ref 0–44)
AST: 20 IU/L (ref 0–40)
Albumin: 4.7 g/dL (ref 4.1–5.1)
Alkaline Phosphatase: 58 IU/L (ref 47–123)
BUN/Creatinine Ratio: 13 (ref 9–20)
BUN: 14 mg/dL (ref 6–20)
Bilirubin Total: 0.6 mg/dL (ref 0.0–1.2)
CO2: 21 mmol/L (ref 20–29)
Calcium: 9.2 mg/dL (ref 8.7–10.2)
Chloride: 100 mmol/L (ref 96–106)
Creatinine, Ser: 1.08 mg/dL (ref 0.76–1.27)
Globulin, Total: 2.7 g/dL (ref 1.5–4.5)
Glucose: 130 mg/dL — ABNORMAL HIGH (ref 70–99)
Potassium: 4.5 mmol/L (ref 3.5–5.2)
Sodium: 138 mmol/L (ref 134–144)
Total Protein: 7.4 g/dL (ref 6.0–8.5)
eGFR: 90 mL/min/1.73

## 2024-07-30 LAB — SYPHILIS: RPR WITH REFLEX TO RPR TITER: RPR Ser Ql: NONREACTIVE

## 2024-07-30 LAB — HIV ANTIBODY (ROUTINE TESTING W REFLEX): HIV Screen 4th Generation wRfx: NONREACTIVE

## 2024-07-30 LAB — TSH: TSH: 1.53 u[IU]/mL (ref 0.450–4.500)

## 2024-07-31 ENCOUNTER — Other Ambulatory Visit: Payer: Self-pay

## 2024-07-31 DIAGNOSIS — Z79899 Other long term (current) drug therapy: Secondary | ICD-10-CM

## 2024-07-31 DIAGNOSIS — Z131 Encounter for screening for diabetes mellitus: Secondary | ICD-10-CM

## 2024-07-31 DIAGNOSIS — R739 Hyperglycemia, unspecified: Secondary | ICD-10-CM

## 2024-07-31 LAB — CHLAMYDIA/GONOCOCCUS/TRICHOMONAS, NAA
Chlamydia by NAA: NEGATIVE
Gonococcus by NAA: NEGATIVE
Trich vag by NAA: NEGATIVE

## 2024-07-31 LAB — CT/GC NAA, PHARYNGEAL
C TRACH RRNA NPH QL PCR: NEGATIVE
N GONORRHOEA RRNA NPH QL PCR: NEGATIVE

## 2024-08-01 ENCOUNTER — Ambulatory Visit: Payer: Self-pay | Admitting: Medical

## 2024-08-01 LAB — HEMOGLOBIN A1C
Est. average glucose Bld gHb Est-mCnc: 100 mg/dL
Hgb A1c MFr Bld: 5.1 % (ref 4.8–5.6)

## 2024-08-11 ENCOUNTER — Other Ambulatory Visit: Payer: Self-pay | Admitting: Internal Medicine

## 2024-08-13 ENCOUNTER — Encounter: Payer: Self-pay | Admitting: Adult Health

## 2024-08-13 ENCOUNTER — Other Ambulatory Visit: Payer: Self-pay

## 2024-08-13 ENCOUNTER — Ambulatory Visit: Payer: Self-pay | Admitting: Adult Health

## 2024-08-13 VITALS — BP 130/90 | HR 94 | Temp 96.1°F | Ht 76.0 in | Wt 195.0 lb

## 2024-08-13 DIAGNOSIS — Z113 Encounter for screening for infections with a predominantly sexual mode of transmission: Secondary | ICD-10-CM

## 2024-08-13 NOTE — Progress Notes (Signed)
 Therapist, [facility] [address] [place], [place] [address]   Office Visit Note  Patient Name: Zachary Becker Date of Birth [birth year]  Medical Record number [id number]  Date of Service: 08/13/2024  Chief Complaint  Patient presents with   Acute Visit    Exposure to STD through sexual partner since last tested. Requesting a throat swab.     HPI Pt is here reporting after his last testing he performed oral sex on a male partner and would like to be retested.  He denies any symptoms or issues.     Current Medication:  Outpatient Encounter Medications as of 08/13/2024  Medication Sig   DAYVIGO 5 MG TABS Take 1 tablet by mouth at bedtime.   DESCOVY  200-25 MG tablet TAKE 1 TABLET BY MOUTH DAILY   lamoTRIgine  (LAMICTAL ) 200 MG tablet Take 200 mg by mouth daily.   lithium  carbonate (LITHOBID) 300 MG ER tablet Take 900 mg by mouth daily.   valACYclovir  (VALTREX ) 500 MG tablet Take 500 mg by mouth daily.   [DISCONTINUED] lisdexamfetamine  (VYVANSE ) 20 MG capsule Take 20 mg by mouth daily.   [DISCONTINUED] predniSONE  (STERAPRED UNI-PAK 21 TAB) 10 MG (21) TBPK tablet Use as directed.   No facility-administered encounter medications on file as of 08/13/2024.      Medical History: Past Medical History:  Diagnosis Date   Anxiety    Depression    Herpes labialis without complication    Substance abuse (HCC) Adolescence     Vital Signs: BP (!) 130/90   Pulse 94   Temp (!) 96.1 F (35.6 C)   Ht 6' 4 (1.93 m)   Wt 195 lb (88.5 kg)   SpO2 99%   BMI 23.74 kg/m    Review of Systems  Constitutional:  Negative for chills, fatigue and fever.    Physical Exam Vitals reviewed.  Constitutional:      Appearance: Normal appearance.  Neurological:     Mental Status: He is alert.     Assessment/Plan: 1. Screening examination for venereal disease (Primary) Discussed available testing options.  Patient elected for Gonorrhea, Chlamydia testing today.  Patient will be  contacted via MyChart when labs results are available.  Encouraged consistent condom use, as well as regular testing (every 3-6 months) when changing sex partners, or when having multiple partners.    - Ct/GC NAA, Pharyngeal        General Counseling: Zachary Becker verbalizes understanding of the findings of todays visit and agrees with plan of treatment. I have discussed any further diagnostic evaluation that may be needed or ordered today. We also reviewed his medications today. he has been encouraged to call the office with any questions or concerns that should arise related to todays visit.   Orders Placed This Encounter  Procedures   Ct/GC NAA, Pharyngeal    No orders of the defined types were placed in this encounter.   Time spent:15 Minutes    Juliene DOROTHA Howells AGNP-C Nurse Practitioner

## 2024-08-15 ENCOUNTER — Other Ambulatory Visit: Payer: Self-pay | Admitting: Adult Health

## 2024-08-15 DIAGNOSIS — Z79899 Other long term (current) drug therapy: Secondary | ICD-10-CM

## 2024-08-15 LAB — CT/GC NAA, PHARYNGEAL
C TRACH RRNA NPH QL PCR: NEGATIVE
N GONORRHOEA RRNA NPH QL PCR: NEGATIVE
